# Patient Record
Sex: Female | Born: 1950
Health system: Southern US, Community
[De-identification: ages and names within clinical notes are randomized; demographics above are authoritative.]

## PROBLEM LIST (undated history)

## (undated) DIAGNOSIS — E785 Hyperlipidemia, unspecified: Secondary | ICD-10-CM

## (undated) DIAGNOSIS — Z8719 Personal history of other diseases of the digestive system: Secondary | ICD-10-CM

## (undated) DIAGNOSIS — E66811 Obesity, class 1: Secondary | ICD-10-CM

## (undated) DIAGNOSIS — I10 Essential (primary) hypertension: Secondary | ICD-10-CM

## (undated) DIAGNOSIS — E669 Obesity, unspecified: Secondary | ICD-10-CM

## (undated) DIAGNOSIS — Z8711 Personal history of peptic ulcer disease: Secondary | ICD-10-CM

## (undated) HISTORY — DX: Obesity, unspecified: E66.9

## (undated) HISTORY — DX: Obesity, class 1: E66.811

## (undated) HISTORY — PX: LAPAROSCOPY: SHX197

## (undated) HISTORY — DX: Hyperlipidemia, unspecified: E78.5

---

## 2004-04-09 ENCOUNTER — Emergency Department (HOSPITAL_COMMUNITY): Admission: EM | Admit: 2004-04-09 | Discharge: 2004-04-09 | Payer: Self-pay | Admitting: *Deleted

## 2004-05-15 ENCOUNTER — Emergency Department (HOSPITAL_COMMUNITY): Admission: EM | Admit: 2004-05-15 | Discharge: 2004-05-15 | Payer: Self-pay | Admitting: Emergency Medicine

## 2004-06-01 ENCOUNTER — Ambulatory Visit: Payer: Self-pay | Admitting: Internal Medicine

## 2004-07-07 ENCOUNTER — Ambulatory Visit (HOSPITAL_COMMUNITY): Admission: RE | Admit: 2004-07-07 | Discharge: 2004-07-07 | Payer: Self-pay | Admitting: Internal Medicine

## 2004-08-30 HISTORY — PX: COLONOSCOPY: SHX174

## 2004-12-06 ENCOUNTER — Emergency Department (HOSPITAL_COMMUNITY): Admission: EM | Admit: 2004-12-06 | Discharge: 2004-12-06 | Payer: Self-pay | Admitting: Family Medicine

## 2004-12-10 ENCOUNTER — Other Ambulatory Visit: Admission: RE | Admit: 2004-12-10 | Discharge: 2004-12-10 | Payer: Self-pay | Admitting: Obstetrics and Gynecology

## 2004-12-18 ENCOUNTER — Ambulatory Visit (HOSPITAL_COMMUNITY): Admission: RE | Admit: 2004-12-18 | Discharge: 2004-12-18 | Payer: Self-pay | Admitting: Obstetrics and Gynecology

## 2005-01-01 ENCOUNTER — Ambulatory Visit: Payer: Self-pay | Admitting: Internal Medicine

## 2005-01-08 ENCOUNTER — Ambulatory Visit: Payer: Self-pay | Admitting: Internal Medicine

## 2005-01-28 HISTORY — PX: COMBINED HYSTEROSCOPY DIAGNOSTIC / D&C: SUR297

## 2005-02-05 ENCOUNTER — Ambulatory Visit (HOSPITAL_COMMUNITY): Admission: RE | Admit: 2005-02-05 | Discharge: 2005-02-05 | Payer: Self-pay | Admitting: Obstetrics and Gynecology

## 2005-02-05 ENCOUNTER — Encounter (INDEPENDENT_AMBULATORY_CARE_PROVIDER_SITE_OTHER): Payer: Self-pay | Admitting: Specialist

## 2005-04-19 ENCOUNTER — Encounter (INDEPENDENT_AMBULATORY_CARE_PROVIDER_SITE_OTHER): Payer: Self-pay | Admitting: *Deleted

## 2005-05-15 ENCOUNTER — Emergency Department (HOSPITAL_COMMUNITY): Admission: EM | Admit: 2005-05-15 | Discharge: 2005-05-15 | Payer: Self-pay | Admitting: Family Medicine

## 2005-05-19 ENCOUNTER — Emergency Department (HOSPITAL_COMMUNITY): Admission: EM | Admit: 2005-05-19 | Discharge: 2005-05-19 | Payer: Self-pay | Admitting: Emergency Medicine

## 2005-12-23 ENCOUNTER — Ambulatory Visit: Payer: Self-pay | Admitting: Internal Medicine

## 2006-05-16 ENCOUNTER — Emergency Department (HOSPITAL_COMMUNITY): Admission: EM | Admit: 2006-05-16 | Discharge: 2006-05-16 | Payer: Self-pay | Admitting: Emergency Medicine

## 2006-08-19 DIAGNOSIS — I10 Essential (primary) hypertension: Secondary | ICD-10-CM | POA: Insufficient documentation

## 2007-04-17 ENCOUNTER — Inpatient Hospital Stay (HOSPITAL_COMMUNITY): Admission: AD | Admit: 2007-04-17 | Discharge: 2007-04-17 | Payer: Self-pay | Admitting: Obstetrics and Gynecology

## 2007-04-17 ENCOUNTER — Telehealth: Payer: Self-pay | Admitting: *Deleted

## 2007-05-17 ENCOUNTER — Ambulatory Visit: Payer: Self-pay | Admitting: *Deleted

## 2007-05-17 ENCOUNTER — Encounter: Payer: Self-pay | Admitting: Obstetrics and Gynecology

## 2007-05-18 ENCOUNTER — Emergency Department (HOSPITAL_COMMUNITY): Admission: EM | Admit: 2007-05-18 | Discharge: 2007-05-18 | Payer: Self-pay | Admitting: Emergency Medicine

## 2007-07-19 ENCOUNTER — Emergency Department (HOSPITAL_COMMUNITY): Admission: EM | Admit: 2007-07-19 | Discharge: 2007-07-19 | Payer: Self-pay | Admitting: Family Medicine

## 2007-08-16 ENCOUNTER — Ambulatory Visit: Payer: Self-pay | Admitting: Internal Medicine

## 2007-08-18 ENCOUNTER — Ambulatory Visit: Payer: Self-pay | Admitting: *Deleted

## 2007-09-29 ENCOUNTER — Ambulatory Visit (HOSPITAL_COMMUNITY): Admission: RE | Admit: 2007-09-29 | Discharge: 2007-09-29 | Payer: Self-pay | Admitting: Family Medicine

## 2007-10-13 ENCOUNTER — Ambulatory Visit: Payer: Self-pay | Admitting: Internal Medicine

## 2007-10-13 LAB — CONVERTED CEMR LAB
ALT: 16 units/L (ref 0–35)
AST: 19 units/L (ref 0–37)
Albumin: 4.1 g/dL (ref 3.5–5.2)
Alkaline Phosphatase: 62 units/L (ref 39–117)
BUN: 14 mg/dL (ref 6–23)
Basophils Absolute: 0 10*3/uL (ref 0.0–0.1)
Basophils Relative: 0 % (ref 0–1)
CO2: 27 meq/L (ref 19–32)
Calcium: 9.3 mg/dL (ref 8.4–10.5)
Chloride: 104 meq/L (ref 96–112)
Cholesterol: 210 mg/dL — ABNORMAL HIGH (ref 0–200)
Creatinine, Ser: 0.57 mg/dL (ref 0.40–1.20)
Eosinophils Absolute: 0 10*3/uL (ref 0.0–0.7)
Eosinophils Relative: 0 % (ref 0–5)
Glucose, Bld: 73 mg/dL (ref 70–99)
HCT: 35.3 % — ABNORMAL LOW (ref 36.0–46.0)
HDL: 67 mg/dL (ref >39)
Hemoglobin: 11.2 g/dL — ABNORMAL LOW (ref 12.0–15.0)
LDL Cholesterol: 126 mg/dL — ABNORMAL HIGH (ref 0–99)
Lymphocytes Relative: 44 % (ref 12–46)
Lymphs Abs: 2.1 10*3/uL (ref 0.7–4.0)
MCHC: 31.7 g/dL (ref 30.0–36.0)
MCV: 88.9 fL (ref 78.0–100.0)
Monocytes Absolute: 0.3 10*3/uL (ref 0.1–1.0)
Monocytes Relative: 7 % (ref 3–12)
Neutro Abs: 2.4 10*3/uL (ref 1.7–7.7)
Neutrophils Relative %: 49 % (ref 43–77)
Platelets: 263 10*3/uL (ref 150–400)
Potassium: 4 meq/L (ref 3.5–5.3)
RBC: 3.97 M/uL (ref 3.87–5.11)
RDW: 13.4 % (ref 11.5–15.5)
Sodium: 142 meq/L (ref 135–145)
Total Bilirubin: 0.3 mg/dL (ref 0.3–1.2)
Total CHOL/HDL Ratio: 3.1
Total Protein: 8 g/dL (ref 6.0–8.3)
Triglycerides: 84 mg/dL (ref <150)
VLDL: 17 mg/dL (ref 0–40)
WBC: 4.9 10*3/uL (ref 4.0–10.5)

## 2007-11-21 ENCOUNTER — Ambulatory Visit: Payer: Self-pay | Admitting: Family Medicine

## 2007-11-24 ENCOUNTER — Ambulatory Visit: Payer: Self-pay | Admitting: Internal Medicine

## 2007-11-24 LAB — CONVERTED CEMR LAB
Ferritin: 60 ng/mL (ref 10–291)
Iron: 50 ug/dL (ref 42–145)
Saturation Ratios: 16 % — ABNORMAL LOW (ref 20–55)
TIBC: 306 ug/dL (ref 250–470)
TSH: 1.087 microintl units/mL (ref 0.350–5.50)
UIBC: 256 ug/dL

## 2007-12-18 ENCOUNTER — Ambulatory Visit: Payer: Self-pay | Admitting: Family Medicine

## 2007-12-21 ENCOUNTER — Ambulatory Visit: Payer: Self-pay | Admitting: Obstetrics and Gynecology

## 2008-01-26 ENCOUNTER — Emergency Department (HOSPITAL_COMMUNITY): Admission: EM | Admit: 2008-01-26 | Discharge: 2008-01-26 | Payer: Self-pay | Admitting: Family Medicine

## 2009-01-16 ENCOUNTER — Ambulatory Visit (HOSPITAL_COMMUNITY): Admission: RE | Admit: 2009-01-16 | Discharge: 2009-01-16 | Payer: Self-pay | Admitting: Family Medicine

## 2009-06-04 ENCOUNTER — Emergency Department (HOSPITAL_COMMUNITY): Admission: EM | Admit: 2009-06-04 | Discharge: 2009-06-04 | Payer: Self-pay | Admitting: Emergency Medicine

## 2009-06-11 ENCOUNTER — Emergency Department (HOSPITAL_COMMUNITY): Admission: EM | Admit: 2009-06-11 | Discharge: 2009-06-11 | Payer: Self-pay | Admitting: Emergency Medicine

## 2009-06-16 ENCOUNTER — Emergency Department (HOSPITAL_COMMUNITY): Admission: EM | Admit: 2009-06-16 | Discharge: 2009-06-16 | Payer: Self-pay | Admitting: Emergency Medicine

## 2010-01-28 ENCOUNTER — Emergency Department (HOSPITAL_COMMUNITY): Admission: EM | Admit: 2010-01-28 | Discharge: 2010-01-28 | Payer: Self-pay | Admitting: Family Medicine

## 2010-09-06 ENCOUNTER — Emergency Department (HOSPITAL_COMMUNITY)
Admission: EM | Admit: 2010-09-06 | Discharge: 2010-09-06 | Payer: Self-pay | Source: Home / Self Care | Admitting: Family Medicine

## 2010-09-20 ENCOUNTER — Encounter: Payer: Self-pay | Admitting: Obstetrics and Gynecology

## 2010-09-30 NOTE — Progress Notes (Signed)
  Phone Note Call from Patient   Summary of Call: Pt called and states she has protrusion from her vagina x 2 days.  Hx of same in 2006. denies pain or bleeding.  referred to women's [facility] for treatment, per Dr Meredith Pel.   Pt informed and will go to women's Initial call taken by: Merrie Roof RN,  April 17, 2007 10:23 AM

## 2010-11-11 ENCOUNTER — Inpatient Hospital Stay (INDEPENDENT_AMBULATORY_CARE_PROVIDER_SITE_OTHER)
Admission: RE | Admit: 2010-11-11 | Discharge: 2010-11-11 | Disposition: A | Discharge status: Home / Self Care | Payer: PRIVATE HEALTH INSURANCE | Source: Ambulatory Visit | Attending: Family Medicine | Admitting: Family Medicine

## 2010-11-11 DIAGNOSIS — R05 Cough: Secondary | ICD-10-CM

## 2010-11-11 DIAGNOSIS — R059 Cough, unspecified: Secondary | ICD-10-CM

## 2010-11-11 LAB — POCT URINALYSIS DIPSTICK
Bilirubin Urine: NEGATIVE
Glucose, UA: NEGATIVE mg/dL
Ketones, ur: NEGATIVE mg/dL
Nitrite: NEGATIVE
Protein, ur: NEGATIVE mg/dL
Specific Gravity, Urine: <=1.005 (ref 1.005–1.030)
Urobilinogen, UA: 0.2 mg/dL (ref 0.0–1.0)
pH: 6 (ref 5.0–8.0)

## 2011-01-12 NOTE — Group Therapy Note (Signed)
[name], [name] NO.:  [id number]   MEDICAL RECORD NO.:  [id number]          PATIENT TYPE:  WOC   LOCATION:  [facility]                   FACILITY:  [facility]   PHYSICIAN:  Wilburt Finlay, M.D.     DATE OF BIRTH:  22-Sep-1950   DATE OF SERVICE:  05/17/2007                                  CLINIC NOTE   CHIEF COMPLAINT:  Pressure between legs, question uterus falling out.   HISTORY OF PRESENT ILLNESS:  Patient is a 60 year old female, gravida 1,  para 1, postmenopausal x2 years, who presents with complaints of feeling  a lot of pressure in her legs.  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  Throughout the day, she notes significantly  increasing pressure between the legs.  By the time she gets home, she  feels that she needs to insert a finger into the vagina to push the  uterus back in.  She denies any urinary complaints.  She has been having  vaginal discharge that has not changed.  Complains also of intermittent  vaginal dryness.  Has not been sexually active for at least four years.  Does express desire to increase level of exercise but feels the pressure  between her legs prohibits her from doing this.  Otherwise patient  denies any nausea, vomiting, fever, chills, stool incontinence, stress  urinary incontinence.   PAST MEDICAL HISTORY:  Significant for hypertension and history of  polyps, possibly cervical polyps that were removed several years ago.   PAST OBSTETRICAL HISTORY:  Vaginal delivery 22 years ago.   PAST SURGICAL HISTORY:  Had a D&C greater than 10 years ago.   ALLERGIES:  PENICILLIN.   SOCIAL HISTORY:  She lives by herself, although her daughter with her  new baby is currently residing with her.  Does not smoke.  Denies drug  use or alcohol use.   PHYSICAL EXAMINATION:  She appeared in no apparent distress.  ABDOMEN:  Soft, nontender, nondistended.  No palpable masses noted.  PELVIC:  Normal external genitalia.  Vaginal  mucosa appear normal.  Cervix mildly prolapsed.  Can see about 1 cm inside the introitus.  No  cervical motion tenderness.  No adnexal masses or tenderness.  The  uterus felt small and retroverted.   ASSESSMENT/PLAN:  1. Routine gynecologic visit:  Pap smear done today.  Patient is to      schedule mammogram and have done as soon as she feels out the      necessary paperwork to get it paid on the sliding scale since she      has no insurance.  2. Uterine prolapse:  Discussed options with her, including the      vaginal hysterectomy versus a pessary.  Patient      opted to have a pessary.  A regular pessary, #5, was inserted at      this visit.  Patient showed how to insert and remove pessary.  All      questions were answered.  Follow up as needed.           ______________________________  Leotis Shames  Yetta Barre, M.D.     LJ/MEDQ  D:  05/17/2007  T:  05/18/2007  Job:  [id number]

## 2011-01-12 NOTE — Group Therapy Note (Signed)
[name], [name] NO.:  [id number]   MEDICAL RECORD NO.:  [id number]          PATIENT TYPE:  WOC   LOCATION:  [facility]                   [facility]ACILITY[facility]  [facility]   PHYSICIAN:  Argentina Donovan, MD        DATE OF BIRTH:  [birth year]   DATE OF SERVICE:                                  CLINIC NOTE   CHIEF COMPLAINT:  Her cervix keeps falling out, she keeps pushing it  back in.   PRESENT ILLNESS:  The patient is a 60 year old gravida 1, para 1-0-0-1 3  who is postmenopausal, who gets the terrible pressure between her legs  all the time, and the cervix actually comes out of the vagina.  She can  push it back in.  They tried to use a pessary on her, but the pessary  bothered her so much, she went to [facility] to have it removed.  She is  in for definitive treatment and was told she needs a hysterectomy with  anterior and posterior colporrhaphy repair.  She has not been sexually  active in some time.  She is unable to exercise because of the pressure  when she is on her feet.  Examination of external genitalia is normal  placing a speculum, with a speculum in the vagina.  The cervix is right  at the introitus and hypertrophied.  Vagina is clean with lost  rugations.  The uterus is normal size, shape and consistency on  bimanual.  Adnexa could not be palpated.  The patient, in my estimation,  needs a total vaginal hysterectomy and repair. Her insurance so runs out  today.  She is going to re-establish it tomorrow.  I told her we had  checked with the financial people, and as soon as she could be back, we  can schedule her for definitive treatment with this problem.   DIAGNOSIS:  Third-degree uterine prolapse with cystorectocele.           ______________________________  Argentina Donovan, MD     PR/MEDQ  D:  12/21/2007  T:  12/21/2007  Job:  [id number]

## 2011-01-15 NOTE — H&P (Signed)
[name], [name]             ACCOUNT NO.:  [id number]   MEDICAL RECORD NO.:  [id number]          PATIENT TYPE:  AMB   LOCATION:  [facility]                           [facility]   PHYSICIAN:  [name], M.D.DATE OF BIRTH:  Oct 27, 1950   DATE OF ADMISSION:  DATE OF DISCHARGE:                                HISTORY & PHYSICAL   This is a 60 year old gravida 1, para 1.   CHIEF COMPLAINT:  Post menopausal bleeding.  The patient is some two years  postmenopausal and noticed an episode of vaginal bleeding for three days in  early April 2006.  Examination revealed a cervical polyp.  The patient is  scheduled for diagnostic/operative hysteroscopy.   MEDICATIONS:  Triamterene/hydrochlorothiazide 37.5/25.   PAST MEDICAL HISTORY:  Hypertension.   PAST SURGICAL HISTORY:  Laparoscopy, 1970s.   ALLERGIES:  None.   FAMILY HISTORY:  Noncontributory.   SOCIAL HISTORY:  The patient denies use of tobacco or significant alcohol.   REVIEW OF SYSTEMS:  Noncontributory.   PHYSICAL EXAMINATION:  GENERAL:  Well-developed, well-nourished, pleasant,  black female in no acute distress.  VITAL SIGNS:  Afebrile.  Vital signs stable.  SKIN:  Warm and dry without lesions.  LYMPH:  There is no supraclavicular, cervical, or inguinal adenopathy.  HEENT:  Normocephalic.  NECK:  Supple with thyromegaly.  CHEST:  Lungs are clear.  CARDIAC:  Regular rate and rhythm without murmurs, gallops, or rubs.  BREASTS:  Exam is deferred.  ABDOMEN:  Soft and nontender without masses or organomegaly.  Bowel sounds  are active.  MUSCULOSKELETAL:  No CVA tenderness.  PELVIC:  External genitalia within normal limits.  Vagina is without gross  lesions.  Cervical examination revealed a polyp.  Bimanual examination  revealed the uterus to be normal size, shape, and contour without adnexal  masses.   IMPRESSION:  1.  Cervical polyp.  2.  Postmenopausal bleeding.  3.  Hypertension.   PLAN:  1.   Diagnostic/operative hysteroscopy.  2.  Dilatation and curettage.  Risks, benefits, complications, and      alternatives were fully discussed with the patient.  She states she      understands and accepts.  Questions were invited and answered.       JAM/MEDQ  D:  02/04/2005  T:  02/04/2005  Job:  [id number]

## 2011-01-15 NOTE — Op Note (Signed)
NAMEMARYFER, [name]             ACCOUNT NO.:  [id number]   MEDICAL RECORD NO.:  [id number]          PATIENT TYPE:  AMB   LOCATION:  [facility]                           [facility]   PHYSICIAN:  James A. Ashley Royalty, M.D.DATE OF BIRTH:  11-18-1950   DATE OF PROCEDURE:  02/05/2005  DATE OF DISCHARGE:                                 OPERATIVE REPORT   PREOPERATIVE DIAGNOSIS:  1.  Cervical versus uterine polyp.  2.  Postmenopausal bleeding, rule out neoplasia.   POSTOPERATIVE DIAGNOSIS:  1.  Cervical versus uterine polyp.  2.  Postmenopausal bleeding, rule out neoplasia.  3.  Apparent endometrial polyp.  4.  Pathology pending.   PROCEDURE:  1.  Diagnostic/operative hysteroscopy.  2.  Dilatation and curettage.   SURGEON:  Rudy Jew. Ashley Royalty, M.D.   ANESTHESIA:  Monitored anesthesia care with 1% Xylocaine paracervical block  (20 mL).   FINDINGS:  Uterus sounded to approximately 7 cm.   ESTIMATED BLOOD LOSS:  50 mL.   COMPLICATIONS:  None.   DRAINS:  None.   DESCRIPTION OF PROCEDURE:  The patient was taken to the operating room and  placed in the dorsal supine position.  After monitored anesthesia care was  administered, she was placed in the lithotomy position and prepped and  draped in the usual fashion for vaginal surgery.  Posterior weighted  retractor was placed per vagina.  The anterior lip of the cervix was grasped  with a single tooth tenaculum.  Approximately 20 mL of 1% Xylocaine were  instilled circumferentially around the cervix to create a paracervical  block.  The uterus was then sounded to 7 cm.  It was noted to be  retroverted.  The cervix was then dilated to a size 29 [place] using [employer]  dilators.  The resectoscope was then placed using Sorbitol as the distention  medium.  The uterine cavity was inspected.  The right tube ostium was  identified.  The left tube ostium was less apparent, but its location  inferred by the contour of the cavity, seen to be located  slightly behind an  undulation in the cavity that did not appear to be abnormal.  Appropriate  photos were obtained.  There was a small apparent polyp in the endometrial  cavity which was removed with the resectoscope using the cutting waveform.  It was submitted to pathology separately for histologic studies.  Coagulation waveform was then used to help assure hemostasis.  Remainder of  the cavity and endocervix was inspected.  No separate polyp was identified.  Appropriate photos were obtained.   Attention was then turned to the curettage.  A medium size curet was  introduced into the uterine cavity and curettage performed.  First a four  quadrant technique was used.  Then a therapeutic technique was used.  Curettings were submitted separately to pathology for histologic studies.   At this point, the instruments were removed from the cervix.  A small  laceration was noted on the tenaculum site.  This was easily closed with 2-0  chromic catgut in a figure-of-eight fashion.  Hemostasis was noted and the  procedure  terminated.   The patient tolerated the procedure extremely well and was returned to the  recovery room in good condition.       JAM/MEDQ  D:  02/05/2005  T:  02/05/2005  Job:  [id number]

## 2011-06-11 LAB — URINALYSIS, ROUTINE W REFLEX MICROSCOPIC
Bilirubin Urine: NEGATIVE
Glucose, UA: NEGATIVE
Hgb urine dipstick: NEGATIVE
Ketones, ur: NEGATIVE
Nitrite: NEGATIVE
Protein, ur: NEGATIVE
Specific Gravity, Urine: >1.03 — ABNORMAL HIGH
Urobilinogen, UA: 0.2
pH: 6

## 2011-06-11 LAB — WET PREP, GENITAL
Clue Cells Wet Prep HPF POC: NONE SEEN
Trich, Wet Prep: NONE SEEN
Yeast Wet Prep HPF POC: NONE SEEN

## 2011-06-11 LAB — URINE MICROSCOPIC-ADD ON

## 2011-06-12 ENCOUNTER — Emergency Department (HOSPITAL_COMMUNITY)
Admission: EM | Admit: 2011-06-12 | Discharge: 2011-06-12 | Disposition: A | Discharge status: Home / Self Care | Payer: PRIVATE HEALTH INSURANCE | Attending: Emergency Medicine | Admitting: Emergency Medicine

## 2011-06-12 DIAGNOSIS — H5789 Other specified disorders of eye and adnexa: Secondary | ICD-10-CM | POA: Insufficient documentation

## 2011-06-12 DIAGNOSIS — Z79899 Other long term (current) drug therapy: Secondary | ICD-10-CM | POA: Insufficient documentation

## 2011-06-12 DIAGNOSIS — H11419 Vascular abnormalities of conjunctiva, unspecified eye: Secondary | ICD-10-CM | POA: Insufficient documentation

## 2011-06-12 DIAGNOSIS — I10 Essential (primary) hypertension: Secondary | ICD-10-CM | POA: Insufficient documentation

## 2011-06-12 DIAGNOSIS — Z7982 Long term (current) use of aspirin: Secondary | ICD-10-CM | POA: Insufficient documentation

## 2011-06-12 DIAGNOSIS — H109 Unspecified conjunctivitis: Secondary | ICD-10-CM | POA: Insufficient documentation

## 2011-06-19 ENCOUNTER — Emergency Department (HOSPITAL_COMMUNITY)
Admission: EM | Admit: 2011-06-19 | Discharge: 2011-06-19 | Disposition: A | Discharge status: Home / Self Care | Payer: Worker's Compensation | Attending: Emergency Medicine | Admitting: Emergency Medicine

## 2011-06-19 ENCOUNTER — Inpatient Hospital Stay (INDEPENDENT_AMBULATORY_CARE_PROVIDER_SITE_OTHER)
Admission: RE | Admit: 2011-06-19 | Discharge: 2011-06-19 | Disposition: A | Discharge status: Home / Self Care | Payer: PRIVATE HEALTH INSURANCE | Source: Ambulatory Visit | Attending: Emergency Medicine | Admitting: Emergency Medicine

## 2011-06-19 ENCOUNTER — Emergency Department (HOSPITAL_COMMUNITY): Payer: Worker's Compensation

## 2011-06-19 DIAGNOSIS — R51 Headache: Secondary | ICD-10-CM

## 2011-06-19 DIAGNOSIS — Y990 Civilian activity done for income or pay: Secondary | ICD-10-CM | POA: Insufficient documentation

## 2011-06-19 DIAGNOSIS — S1093XA Contusion of unspecified part of neck, initial encounter: Secondary | ICD-10-CM | POA: Insufficient documentation

## 2011-06-19 DIAGNOSIS — I10 Essential (primary) hypertension: Secondary | ICD-10-CM | POA: Insufficient documentation

## 2011-06-19 DIAGNOSIS — R42 Dizziness and giddiness: Secondary | ICD-10-CM | POA: Insufficient documentation

## 2011-06-19 DIAGNOSIS — S0990XA Unspecified injury of head, initial encounter: Secondary | ICD-10-CM

## 2011-06-19 DIAGNOSIS — IMO0002 Reserved for concepts with insufficient information to code with codable children: Secondary | ICD-10-CM | POA: Insufficient documentation

## 2011-06-19 DIAGNOSIS — H9209 Otalgia, unspecified ear: Secondary | ICD-10-CM | POA: Insufficient documentation

## 2011-06-19 DIAGNOSIS — S0003XA Contusion of scalp, initial encounter: Secondary | ICD-10-CM | POA: Insufficient documentation

## 2011-12-02 ENCOUNTER — Other Ambulatory Visit: Payer: Self-pay | Admitting: Obstetrics and Gynecology

## 2011-12-02 DIAGNOSIS — Z1231 Encounter for screening mammogram for malignant neoplasm of breast: Secondary | ICD-10-CM

## 2011-12-05 ENCOUNTER — Encounter (HOSPITAL_COMMUNITY): Payer: Self-pay | Admitting: *Deleted

## 2011-12-05 ENCOUNTER — Emergency Department (INDEPENDENT_AMBULATORY_CARE_PROVIDER_SITE_OTHER)
Admission: EM | Admit: 2011-12-05 | Discharge: 2011-12-05 | Disposition: A | Discharge status: Home / Self Care | Payer: Self-pay | Source: Home / Self Care | Attending: Family Medicine | Admitting: Family Medicine

## 2011-12-05 DIAGNOSIS — J0100 Acute maxillary sinusitis, unspecified: Secondary | ICD-10-CM

## 2011-12-05 DIAGNOSIS — J40 Bronchitis, not specified as acute or chronic: Secondary | ICD-10-CM

## 2011-12-05 HISTORY — DX: Essential (primary) hypertension: I10

## 2011-12-05 MED ORDER — AZITHROMYCIN 250 MG PO TABS: ORAL_TABLET | ORAL | Status: AC

## 2011-12-05 MED ORDER — CETIRIZINE HCL 10 MG PO CHEW: 10.0000 mg | CHEWABLE_TABLET | Freq: Every day | ORAL | Status: DC

## 2011-12-05 NOTE — ED Notes (Addendum)
Pt with onset of cough and congestion x 2 weeks taking mucinex DM and coricidin without relief - pt bp elevated

## 2011-12-05 NOTE — Discharge Instructions (Signed)
Bronchitis  Bronchitis is the body's way of reacting to injury and/or infection (inflammation) of the bronchi. Bronchi are the air tubes that extend from the windpipe into the lungs. If the inflammation becomes severe, it may cause shortness of breath.  CAUSES   Inflammation may be caused by:   A virus.   Germs (bacteria).   Dust.   Allergens.   Pollutants and many other irritants.  The cells lining the bronchial tree are covered with tiny hairs (cilia). These constantly beat upward, away from the lungs, toward the mouth. This keeps the lungs free of pollutants. When these cells become too irritated and are unable to do their job, mucus begins to develop. This causes the characteristic cough of bronchitis. The cough clears the lungs when the cilia are unable to do their job. Without either of these protective mechanisms, the mucus would settle in the lungs. Then you would develop pneumonia.  Smoking is a common cause of bronchitis and can contribute to pneumonia. Stopping this habit is the single most important thing you can do to help yourself.  TREATMENT    Your caregiver may prescribe an antibiotic if the cough is caused by bacteria. Also, medicines that open up your airways make it easier to breathe. Your caregiver may also recommend or prescribe an expectorant. It will loosen the mucus to be coughed up. Only take over-the-counter or prescription medicines for pain, discomfort, or fever as directed by your caregiver.   Removing whatever causes the problem (smoking, for example) is critical to preventing the problem from getting worse.   Cough suppressants may be prescribed for relief of cough symptoms.   Inhaled medicines may be prescribed to help with symptoms now and to help prevent problems from returning.   For those with recurrent (chronic) bronchitis, there may be a need for steroid medicines.  SEEK IMMEDIATE MEDICAL CARE IF:    During treatment, you develop more pus-like mucus (purulent  sputum).   You have a fever.   Your baby is older than 3 months with a rectal temperature of 102 F (38.9 C) or higher.   Your baby is 3 months old or younger with a rectal temperature of 100.4 F (38 C) or higher.   You become progressively more ill.   You have increased difficulty breathing, wheezing, or shortness of breath.  It is necessary to seek immediate medical care if you are elderly or sick from any other disease.  MAKE SURE YOU:    Understand these instructions.   Will watch your condition.   Will get help right away if you are not doing well or get worse.  Document Released: 08/16/2005 Document Revised: 08/05/2011 Document Reviewed: 06/25/2008  ExitCare Patient Information 2012 [employer]  Sinusitis  Sinuses are air pockets within the bones of your face. The growth of bacteria within a sinus leads to infection. The infection prevents the sinuses from draining. This infection is called sinusitis.  SYMPTOMS   There will be different areas of pain depending on which sinuses have become infected.   The maxillary sinuses often produce pain beneath the eyes.   Frontal sinusitis may cause pain in the middle of the forehead and above the eyes.  Other problems (symptoms) include:   Toothaches.   Colored, pus-like (purulent) drainage from the nose.   Swelling, warmth, and tenderness over the sinus areas may be signs of infection.  TREATMENT   Sinusitis is most often determined by an exam.X-rays may be taken. If x-rays have   been taken, make sure you obtain your results or find out how you are to obtain them. Your caregiver may give you medications (antibiotics). These are medications that will help kill the bacteria causing the infection. You may also be given a medication (decongestant) that helps to reduce sinus swelling.   HOME CARE INSTRUCTIONS    Only take over-the-counter or prescription medicines for pain, discomfort, or fever as directed by your caregiver.   Drink extra fluids. Fluids  help thin the mucus so your sinuses can drain more easily.   Applying either moist heat or ice packs to the sinus areas may help relieve discomfort.   Use saline nasal sprays to help moisten your sinuses. The sprays can be found at your local drugstore.  SEEK IMMEDIATE MEDICAL CARE IF:   You have a fever.   You have increasing pain, severe headaches, or toothache.   You have nausea, vomiting, or drowsiness.   You develop unusual swelling around the face or trouble seeing.  MAKE SURE YOU:    Understand these instructions.   Will watch your condition.   Will get help right away if you are not doing well or get worse.  Document Released: 08/16/2005 Document Revised: 08/05/2011 Document Reviewed: 03/15/2007  Exit[employer] Patient Information 2012 [employer].

## 2011-12-05 NOTE — ED Provider Notes (Signed)
History     CSN: [id number]  Arrival date & time 12/05/11  1519   First MD Initiated Contact with Patient 12/05/11 1528      Chief Complaint  Patient presents with  . Cough  . Nasal Congestion    (Consider location/radiation/quality/duration/timing/severity/associated sxs/prior treatment) Patient is a 61 y.o. female presenting with cough. The history is provided by the patient.  Cough This is a new problem. The current episode started more than 1 week ago (on going before Easter). The problem occurs constantly. The problem has been gradually worsening. The cough is productive of sputum. There has been no fever. Associated symptoms include headaches and rhinorrhea. Pertinent negatives include no chest pain, no chills, no sweats, no weight loss, no ear congestion, no ear pain, no sore throat, no myalgias, no shortness of breath, no wheezing and no eye redness. She has tried cough syrup for the symptoms. The treatment provided no relief. She is not a smoker. Her past medical history is significant for emphysema. Her past medical history does not include bronchitis, pneumonia, bronchiectasis, COPD or asthma.    Past Medical History  Diagnosis Date  . Hypertension     History reviewed. No pertinent past surgical history.  History reviewed. No pertinent family history.  History  Substance Use Topics  . Smoking status: Never Smoker   . Smokeless tobacco: Not on file  . Alcohol Use: No    OB History    Grav Para Term Preterm Abortions TAB SAB Ect Mult Living                  Review of Systems  Constitutional: Negative for chills and weight loss.  HENT: Positive for rhinorrhea. Negative for ear pain and sore throat.   Eyes: Negative for redness.  Respiratory: Positive for cough. Negative for shortness of breath and wheezing.   Cardiovascular: Negative for chest pain.  Musculoskeletal: Negative for myalgias.  Neurological: Positive for headaches.    Allergies   Penicillins  Home Medications   Current Outpatient Rx  Name Route Sig Dispense Refill  . ASPIRIN 81 MG PO TABS Oral Take 81 mg by mouth daily.    . CORICIDIN HBP COLD/FLU PO Oral Take by mouth.    Marland Kitchen VITAMIN D 1000 UNITS PO TABS Oral Take 1,000 Units by mouth daily.    Marland Kitchen DM-GUAIFENESIN ER 30-600 MG PO TB12 Oral Take 1 tablet by mouth every 12 (twelve) hours.    . OMEGA-3 FATTY ACIDS 1000 MG PO CAPS Oral Take 2 g by mouth daily.    . MULTIVITAMINS PO CAPS Oral Take 1 capsule by mouth daily.    Marland Kitchen PRESCRIPTION MEDICATION  Pt takes bp med and fluid pill unknown name      BP 147/101  Pulse 89  Temp(Src) 98.2 F (36.8 C) (Oral)  Resp 20  SpO2 95%  Physical Exam  Nursing note and vitals reviewed. Constitutional: She is oriented to person, place, and time. She appears well-developed and well-nourished. No distress.  HENT:  Head: Normocephalic and atraumatic.  Right Ear: Tympanic membrane normal.  Left Ear: Tympanic membrane normal.  Nose: Nose normal.  Mouth/Throat: Oropharynx is clear and moist. No oropharyngeal exudate.  Eyes: Right eye exhibits no discharge. Left eye exhibits no discharge. No scleral icterus.  Neck: Neck supple.  Cardiovascular: Normal rate, regular rhythm and normal heart sounds.   Pulmonary/Chest: Effort normal. No respiratory distress. She has no wheezes. She has rhonchi. She has no rales.  Lymphadenopathy:  She has cervical adenopathy.  Neurological: She is alert and oriented to person, place, and time.  Skin: Skin is warm and dry. There is erythema.    ED Course  Procedures (including critical care time)   Sinusitis/bronchitis z pack  Zyrtec 10mg  po q day   MDM          Hassan Rowan, MD 12/05/11 2123

## 2012-01-13 ENCOUNTER — Ambulatory Visit (INDEPENDENT_AMBULATORY_CARE_PROVIDER_SITE_OTHER): Payer: Self-pay | Admitting: Obstetrics & Gynecology

## 2012-01-13 ENCOUNTER — Encounter: Payer: Self-pay | Admitting: Obstetrics & Gynecology

## 2012-01-13 ENCOUNTER — Ambulatory Visit (HOSPITAL_COMMUNITY)
Admission: RE | Admit: 2012-01-13 | Discharge: 2012-01-13 | Disposition: A | Discharge status: Home / Self Care | Payer: Self-pay | Source: Ambulatory Visit | Attending: Obstetrics and Gynecology | Admitting: Obstetrics and Gynecology

## 2012-01-13 VITALS — BP 139/95 | HR 73 | Temp 97.6°F | Ht 63.0 in | Wt 169.7 lb

## 2012-01-13 DIAGNOSIS — Z1231 Encounter for screening mammogram for malignant neoplasm of breast: Secondary | ICD-10-CM

## 2012-01-13 DIAGNOSIS — N814 Uterovaginal prolapse, unspecified: Secondary | ICD-10-CM

## 2012-01-13 DIAGNOSIS — Z1239 Encounter for other screening for malignant neoplasm of breast: Secondary | ICD-10-CM

## 2012-01-13 MED ORDER — LISINOPRIL 10 MG PO TABS: 10.0000 mg | ORAL_TABLET | Freq: Every day | ORAL | Status: DC

## 2012-01-13 MED ORDER — ESTROGENS, CONJUGATED 0.625 MG/GM VA CREA: TOPICAL_CREAM | VAGINAL | Status: DC

## 2012-01-13 MED ORDER — HYDROCHLOROTHIAZIDE 12.5 MG PO TABS: 12.5000 mg | ORAL_TABLET | Freq: Every day | ORAL | Status: DC

## 2012-01-13 NOTE — Progress Notes (Signed)
No complaints today.  Pap Smear:    Pap smear not performed today. Patients last Pap smear was 10/25/11 at the free cervical cancer screening at the [facility] and normal. Per patient she has no history of abnormal Pap smears. Pap smear result above is in EPIC under media.  Physical exam: Breasts Breasts symmetrical. No skin abnormalities bilateral breasts. No nipple retraction bilateral breasts. No nipple discharge bilateral breasts. No lymphadenopathy. No lumps palpated bilateral breasts. No complaints of pain or tenderness on exam         Pelvic/Bimanual No Pap smear completed today since last Pap smear was 10/25/11 and normal. Pap smear not indicated per BCCCP guidelines.   Taught patient how to perform BSE. Patient did not need a Pap smear today due to last Pap smear was 10/25/11. Let her know BCCCP will cover Pap smears every 3 years unless has a history of abnormal Pap smears. Patient is escorted to mammography for a screening mammogram. Let patient know will follow up with her within the next couple weeks with results. Patient verbalized understanding.

## 2012-01-13 NOTE — Progress Notes (Signed)
  Subjective:    Patient ID: [name], female    DOB: [birth year], 61 y.o.   MRN: [id number]  HPI  Ms. Gingerich has a 2 year history of cystocele and uterine prolapse. She tried a pessary in the past but doesn't think that it fit right. She wants it "tucked" but does not want a hysterectomy. She denies current urinary incontinence but gives a distant history of some GSUI.  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 I have counseled her about surgery and pessary. I have told her that if she wants a surgical option, that a hysterectomy would need to be done. I also told her about the failure rate of TVH/ant repair with regard to recurrent prolapse. She wants to try a different pessary. She will use vaginal estrogen for 1 month prior to pessary placement. I have agreed to refill her BP meds for 1 month while she finds a family practice doctor to manage her HTN.

## 2012-01-13 NOTE — Progress Notes (Signed)
Patient states she is not taking hctz or lisinopril because her Rx ran out

## 2012-01-26 ENCOUNTER — Telehealth: Payer: Self-pay | Admitting: *Deleted

## 2012-01-26 NOTE — Telephone Encounter (Signed)
Patient called stating was seen in office on 5/16 and was given a cream that cost $200 can not afford that. Would like call back

## 2012-01-26 NOTE — Telephone Encounter (Signed)
Patient was prescribed Premarin, it is too expensive for her. Will route to Dr. Marice Potter for alternate medicine.

## 2012-02-21 ENCOUNTER — Telehealth: Payer: Self-pay | Admitting: *Deleted

## 2012-02-21 ENCOUNTER — Encounter: Payer: Self-pay | Admitting: *Deleted

## 2012-02-21 NOTE — Telephone Encounter (Signed)
Telephoned patient at home # and left message to return call to [phone]. 

## 2012-02-21 NOTE — Telephone Encounter (Signed)
Pt returned call and advised pap smear was normal.

## 2012-02-23 ENCOUNTER — Ambulatory Visit: Payer: Self-pay | Admitting: Obstetrics & Gynecology

## 2012-03-13 ENCOUNTER — Telehealth: Payer: Self-pay

## 2012-03-13 NOTE — Telephone Encounter (Signed)
Pt called and stated that she had a concern about her pessary insertion could someone please give [place] a call.

## 2012-03-15 ENCOUNTER — Other Ambulatory Visit: Payer: Self-pay

## 2012-03-15 MED ORDER — ESTROGENS, CONJUGATED 0.625 MG/GM VA CREA: TOPICAL_CREAM | VAGINAL | Status: DC

## 2012-03-15 NOTE — Telephone Encounter (Signed)
Called pt and pt informed me that the premarin cream that she was prescribed is to expensive could she have something else.  I advised pt that she could go to [facility] and they have the generic Rx for $4.00.  Pt gave me [facility] on [address] to e-prescribe medication and stated that she would go pick up the Rx first then call to schedule an appt so that she will be taking the cream for 30 days. Pt had no further questions.

## 2012-03-15 NOTE — Telephone Encounter (Signed)
Called pt and informed pt that [facility] does not have a generic for premarin.  Per Dr. Marice Potter pt can have estrogen cream compounded.  Called [facility] @ [phone] and prescribed estrogen cream for the pt for $38.00 per month.  Gave pt info to [facility] and the # and informed her to wait at least 4 days for the cream to be compounded. Pt stated understanding and had no further questions.

## 2012-04-24 ENCOUNTER — Telehealth: Payer: Self-pay | Admitting: Medical

## 2012-04-24 NOTE — Telephone Encounter (Signed)
Patient presents to the window with questions about the cream that was called in. Dr. Marice Potter had prescribed premarin and this was too expensive. The patient was then instructed to go to [facility] and get the generic for premarin. The patient was not aware that this was the exact same medication and was unsure or how to take it and how long to take it. I have explained why it was prescribed and told her to start using it as soon as possible and up until her next appointment for a pessary fitting as was originally planned by Dr. Marice Potter. The patient will call next week to get an appointment for early October for her pessary fitting. The patient voices no other questions or concerns at this time.

## 2012-05-28 ENCOUNTER — Encounter (HOSPITAL_COMMUNITY): Payer: Self-pay | Admitting: *Deleted

## 2012-05-28 ENCOUNTER — Emergency Department (INDEPENDENT_AMBULATORY_CARE_PROVIDER_SITE_OTHER)
Admission: EM | Admit: 2012-05-28 | Discharge: 2012-05-28 | Disposition: A | Discharge status: Home / Self Care | Payer: Self-pay | Source: Home / Self Care | Attending: Family Medicine | Admitting: Family Medicine

## 2012-05-28 DIAGNOSIS — S39012A Strain of muscle, fascia and tendon of lower back, initial encounter: Secondary | ICD-10-CM

## 2012-05-28 DIAGNOSIS — S335XXA Sprain of ligaments of lumbar spine, initial encounter: Secondary | ICD-10-CM

## 2012-05-28 MED ORDER — CYCLOBENZAPRINE HCL 5 MG PO TABS: 5.0000 mg | ORAL_TABLET | Freq: Three times a day (TID) | ORAL | Status: DC | PRN

## 2012-05-28 MED ORDER — DICLOFENAC POTASSIUM 50 MG PO TABS: 50.0000 mg | ORAL_TABLET | Freq: Three times a day (TID) | ORAL | Status: DC

## 2012-05-28 NOTE — ED Notes (Signed)
Pt reports lower back pain for the past week with no relief from otc rubs and back support.

## 2012-05-28 NOTE — ED Provider Notes (Signed)
History     CSN: [id number]  Arrival date & time 05/28/12  0907   First MD Initiated Contact with Patient 05/28/12 [phone]      Chief Complaint  Patient presents with  . Back Pain    (Consider location/radiation/quality/duration/timing/severity/associated sxs/prior treatment) Patient is a 61 y.o. female presenting with back pain. The history is provided by the patient.  Back Pain  This is a new problem. The current episode started more than 2 days ago. The problem has been gradually worsening. The pain is associated with lifting heavy objects (moving furniture in house  after daughter moved out ). The pain is present in the lumbar spine. Worse during: worse with sitting and getting out of bed. Pertinent negatives include no numbness, no abdominal pain, no bowel incontinence, no perianal numbness, no bladder incontinence, no pelvic pain, no leg pain, no paresthesias and no weakness.    Past Medical History  Diagnosis Date  . Hypertension   . Hyperlipidemia   . Obesity (BMI 30.0-34.9)     Past Surgical History  Procedure Date  . Colonoscopy 2006    Family History  Problem Relation Age of Onset  . Hypertension Mother   . Hypertension Sister   . Hypertension Maternal Aunt   . Heart disease Maternal Aunt   . Hypertension Maternal Uncle     History  Substance Use Topics  . Smoking status: Never Smoker   . Smokeless tobacco: Not on file  . Alcohol Use: No    OB History    Grav Para Term Preterm Abortions TAB SAB Ect Mult Living                  Review of Systems  Constitutional: Negative.   Cardiovascular: Negative.   Gastrointestinal: Negative.  Negative for abdominal pain and bowel incontinence.  Genitourinary: Negative.  Negative for bladder incontinence and pelvic pain.  Musculoskeletal: Positive for back pain.  Neurological: Negative for weakness, numbness and paresthesias.    Allergies  Penicillins  Home Medications   Current Outpatient Rx  Name Route  Sig Dispense Refill  . ASPIRIN 81 MG PO TABS Oral Take 81 mg by mouth daily.    Marland Kitchen CETIRIZINE HCL 10 MG PO CHEW Oral Chew 1 tablet (10 mg total) by mouth daily. 30 tablet 0  . CORICIDIN HBP COLD/FLU PO Oral Take by mouth.    Marland Kitchen VITAMIN D 1000 UNITS PO TABS Oral Take 1,000 Units by mouth daily.    Marland Kitchen ESTROGENS, CONJUGATED 0.625 MG/GM VA CREA  Place 1 gram of cream per vagina every other night Pt will request generic for Rx. 42.5 g 12  . CYCLOBENZAPRINE HCL 5 MG PO TABS Oral Take 1 tablet (5 mg total) by mouth 3 (three) times daily as needed for muscle spasms. 30 tablet 0  . DM-GUAIFENESIN ER 30-600 MG PO TB12 Oral Take 1 tablet by mouth every 12 (twelve) hours.    Marland Kitchen DICLOFENAC POTASSIUM 50 MG PO TABS Oral Take 1 tablet (50 mg total) by mouth 3 (three) times daily. For back pain 30 tablet 0  . OMEGA-3 FATTY ACIDS 1000 MG PO CAPS Oral Take 2 g by mouth daily.    Marland Kitchen HYDROCHLOROTHIAZIDE 12.5 MG PO TABS Oral Take 1 tablet (12.5 mg total) by mouth daily. 31 tablet 0  . LISINOPRIL 10 MG PO TABS Oral Take 1 tablet (10 mg total) by mouth daily. 31 tablet 0  . MULTIVITAMINS PO CAPS Oral Take 1 capsule by mouth daily.    Marland Kitchen  PRESCRIPTION MEDICATION  Pt takes bp med and fluid pill unknown name      BP 161/89  Pulse 85  Temp 97.7 F (36.5 C) (Oral)  Resp 18  SpO2 100%  Physical Exam  Nursing note and vitals reviewed. Constitutional: She appears well-developed and well-nourished.  Abdominal: Soft. Bowel sounds are normal.  Musculoskeletal: She exhibits tenderness.       Lumbar back: She exhibits decreased range of motion, tenderness and spasm. She exhibits normal pulse.  Neurological: She is alert.  Skin: Skin is warm and dry.    ED Course  Procedures (including critical care time)  Labs Reviewed - No data to display No results found.   1. Strain of lumbar paraspinous muscle       MDM         Linna Hoff, MD 05/28/12 [phone]

## 2012-05-28 NOTE — ED Notes (Signed)
Call from [facility], asking to change medication dose to 75 mg (from 50 mg) and from TID to QD

## 2012-06-21 ENCOUNTER — Emergency Department (INDEPENDENT_AMBULATORY_CARE_PROVIDER_SITE_OTHER): Payer: Self-pay

## 2012-06-21 ENCOUNTER — Emergency Department (INDEPENDENT_AMBULATORY_CARE_PROVIDER_SITE_OTHER)
Admission: EM | Admit: 2012-06-21 | Discharge: 2012-06-21 | Disposition: A | Discharge status: Home / Self Care | Payer: Self-pay | Source: Home / Self Care | Attending: Emergency Medicine | Admitting: Emergency Medicine

## 2012-06-21 DIAGNOSIS — S335XXA Sprain of ligaments of lumbar spine, initial encounter: Secondary | ICD-10-CM

## 2012-06-21 NOTE — ED Notes (Signed)
Reports pulled muscle in back and groin area on Saturday.   Patient states she picked up potatoes and the bag was heavy.  Patient reports this is the second time for this incident.  Reports she fell outside trying to come in for appointment.  Reports she fell on hands and is okay.

## 2012-06-26 ENCOUNTER — Emergency Department (HOSPITAL_COMMUNITY)
Admission: EM | Admit: 2012-06-26 | Discharge: 2012-06-26 | Disposition: A | Discharge status: Home / Self Care | Payer: Self-pay | Source: Home / Self Care | Attending: Emergency Medicine | Admitting: Emergency Medicine

## 2012-06-26 ENCOUNTER — Encounter (HOSPITAL_COMMUNITY): Payer: Self-pay | Admitting: *Deleted

## 2012-06-26 DIAGNOSIS — N760 Acute vaginitis: Secondary | ICD-10-CM

## 2012-06-26 DIAGNOSIS — IMO0002 Reserved for concepts with insufficient information to code with codable children: Secondary | ICD-10-CM

## 2012-06-26 DIAGNOSIS — A499 Bacterial infection, unspecified: Secondary | ICD-10-CM

## 2012-06-26 DIAGNOSIS — B9689 Other specified bacterial agents as the cause of diseases classified elsewhere: Secondary | ICD-10-CM

## 2012-06-26 LAB — POCT URINALYSIS DIP (DEVICE)
Bilirubin Urine: NEGATIVE
Glucose, UA: NEGATIVE mg/dL
Hgb urine dipstick: NEGATIVE
Ketones, ur: NEGATIVE mg/dL
Leukocytes, UA: NEGATIVE
Nitrite: NEGATIVE
Protein, ur: NEGATIVE mg/dL
Specific Gravity, Urine: 1.02 (ref 1.005–1.030)
Urobilinogen, UA: 1 mg/dL (ref 0.0–1.0)
pH: 7 (ref 5.0–8.0)

## 2012-06-26 LAB — WET PREP, GENITAL
Trich, Wet Prep: NONE SEEN
WBC, Wet Prep HPF POC: NONE SEEN
Yeast Wet Prep HPF POC: NONE SEEN

## 2012-06-26 MED ORDER — METRONIDAZOLE 500 MG PO TABS: 500.0000 mg | ORAL_TABLET | Freq: Two times a day (BID) | ORAL | Status: DC

## 2012-06-26 NOTE — ED Provider Notes (Signed)
Chief Complaint  Patient presents with  . Leg Pain    History of Present Illness:   The patient is a 61 year old female who presents today with multiple issues. She's been here twice before for lower back problems. She strained her lower back about 2 weeks ago and then injured again this past Saturday. She was seen here at the onset of the pain and was given muscle relaxers and pain pills. These did help a little bit. She was seen again about 5 days ago an x-ray was done at that time which showed degenerative disease of the lumbar spine. She was referred to go for orthopedics but has not yet made an appointment. She still bothered by the lower back pain which is worse when she gets up from a sitting or lying position to a stand and is better with walking. She notes aching in her legs and hips left more so than right and is sometimes goes all way down to her feet. She denies any numbness or tingling. There is no muscle weakness. She denies any abdominal pain, incontinence of urine or stool or saddle anesthesia. She's had no fever, chills, or weight loss. Her second problem has been that of what she thinks is a urinary odor. She denies any dysuria, frequency, urgency, or blood in the urine. She has a history of uterine prolapse and is seeing a gynecologist for this. The gynecologist as recommended a pessary. She denies any vaginal discharge, itching, irritation, or irregular bleeding.  Review of Systems:  Other than noted above, the patient denies any of the following symptoms: Systemic:  No fever, chills, sweats, fatigue, or weight loss. GI:  No abdominal pain, nausea, anorexia, vomiting, diarrhea, constipation, melena or hematochezia. GU:  No dysuria, frequency, urgency, hematuria, vaginal discharge, itching, or abnormal vaginal bleeding. Skin:  No rash or itching.  PMFSH:  Past medical history, family history, social history, meds, and allergies were reviewed.  Physical Exam:   Vital signs:  BP  149/88  Pulse 80  Temp 98.1 F (36.7 C) (Oral)  Resp 16  SpO2 100% General:  Alert, oriented and in no distress. Lungs:  Breath sounds clear and equal bilaterally.  No wheezes, rales or rhonchi. Heart:  Regular rhythm.  No gallops or murmers. Abdomen:  Soft, flat and non-distended.  No organomegaly or mass.  No tenderness, guarding or rebound.  Bowel sounds normally active. Back: There is no pain to palpation, no CVA tenderness. She has a somewhat limited range of motion with 45 of flexion, 10 of extension, 20 of lateral bending, and 45 of rotation with pain. Straight leg raising is positive on the left with a negative Lasegue's sign and negative popliteal compression. Straight leg raising is negative on the right. Pelvic exam:  Normal external genitalia. She has uterine prolapse. There was no discharge, bleeding, older. Vaginal mucosa was atrophic. Cervix appeared normal. Uterus was midposition and normal in size and shape. There were no adnexal masses or tenderness. Neurological: Normal muscle strength, sensation, and DTRs. Skin:  Clear, warm and dry.  Labs:   Results for orders placed during the hospital encounter of 06/26/12  POCT URINALYSIS DIP (DEVICE)      Component Value Range   Glucose, UA NEGATIVE  NEGATIVE mg/dL   Bilirubin Urine NEGATIVE  NEGATIVE   Ketones, ur NEGATIVE  NEGATIVE mg/dL   Specific Gravity, Urine 1.020  1.005 - 1.030   Hgb urine dipstick NEGATIVE  NEGATIVE   pH 7.0  5.0 - 8.0  Protein, ur NEGATIVE  NEGATIVE mg/dL   Urobilinogen, UA 1.0  0.0 - 1.0 mg/dL   Nitrite NEGATIVE  NEGATIVE   Leukocytes, UA NEGATIVE  NEGATIVE  WET PREP, GENITAL      Component Value Range   Yeast Wet Prep HPF POC NONE SEEN  NONE SEEN   Trich, Wet Prep NONE SEEN  NONE SEEN   Clue Cells Wet Prep HPF POC MODERATE (*) NONE SEEN   WBC, Wet Prep HPF POC NONE SEEN  NONE SEEN     Assessment:  The primary encounter diagnosis was Bacterial vaginosis. A diagnosis of Degenerative disc  disease was also pertinent to this visit.  Plan:   1.  The following meds were prescribed:   New Prescriptions   METRONIDAZOLE (FLAGYL) 500 MG TABLET    Take 1 tablet (500 mg total) by mouth 2 (two) times daily.   2.  The patient was instructed in symptomatic care and handouts were given. I suggested that she followup with [facility] orthopedics as had been previously recommended. A urine culture was obtained as well, however since her UA was negative, I doubt that she has a UTI. 3.  The patient was told to return if becoming worse in any way, if no better in 3 or 4 days, and given some red flag symptoms that would indicate earlier return.    Reuben Likes, MD 06/26/12 [phone]

## 2012-06-26 NOTE — ED Notes (Signed)
Pt  Also  Reports  Foul  Smelling  Urine  As well

## 2012-06-26 NOTE — ED Notes (Signed)
Pt  Reports  Pain  l  Leg       And  Groin  Area        Pt  Seen  6  Days  Ago  ucc   Pt  Ambulated  To  Room  With a  Steady  Fluid  Gait  -  Pt is  Sitting  Upright on  Exam table  Speaking in complete  sentances

## 2012-06-27 LAB — GC/CHLAMYDIA PROBE AMP, GENITAL
Chlamydia, DNA Probe: NEGATIVE
GC Probe Amp, Genital: NEGATIVE

## 2012-06-27 LAB — URINE CULTURE
Colony Count: NO GROWTH
Culture: NO GROWTH

## 2012-06-28 NOTE — ED Notes (Signed)
GC/Chlamydia neg., Wet prep: mod. clue cells. Pt. adequately treated with Flagyl. [name] 06/28/2012

## 2012-08-30 ENCOUNTER — Emergency Department (INDEPENDENT_AMBULATORY_CARE_PROVIDER_SITE_OTHER)
Admission: EM | Admit: 2012-08-30 | Discharge: 2012-08-30 | Disposition: A | Discharge status: Home / Self Care | Payer: Self-pay | Source: Home / Self Care | Attending: Emergency Medicine | Admitting: Emergency Medicine

## 2012-08-30 ENCOUNTER — Encounter (HOSPITAL_COMMUNITY): Payer: Self-pay | Admitting: *Deleted

## 2012-08-30 DIAGNOSIS — L0291 Cutaneous abscess, unspecified: Secondary | ICD-10-CM

## 2012-08-30 DIAGNOSIS — J209 Acute bronchitis, unspecified: Secondary | ICD-10-CM

## 2012-08-30 MED ORDER — PREDNISONE 5 MG PO KIT: 1.0000 | PACK | Freq: Every day | ORAL | Status: DC

## 2012-08-30 MED ORDER — SULFAMETHOXAZOLE-TMP DS 800-160 MG PO TABS: 2.0000 | ORAL_TABLET | Freq: Two times a day (BID) | ORAL | Status: DC

## 2012-08-30 MED ORDER — BENZONATATE 200 MG PO CAPS: 200.0000 mg | ORAL_CAPSULE | Freq: Three times a day (TID) | ORAL | Status: DC | PRN

## 2012-08-30 MED ORDER — ALBUTEROL SULFATE HFA 108 (90 BASE) MCG/ACT IN AERS: 1.0000 | INHALATION_SPRAY | Freq: Four times a day (QID) | RESPIRATORY_TRACT | Status: DC | PRN

## 2012-08-30 MED ORDER — AZITHROMYCIN 250 MG PO TABS: ORAL_TABLET | ORAL | Status: DC
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
## 2012-08-30 NOTE — ED Notes (Signed)
C/O chest congestion, postnasal drainage x 3 weeks.  Has been taking Mucinex DM, Robitussin, and Delsym without relief.  Denies fevers.  Also noticed tender lesion to forehead last night.

## 2013-02-21 ENCOUNTER — Emergency Department (INDEPENDENT_AMBULATORY_CARE_PROVIDER_SITE_OTHER)
Admission: EM | Admit: 2013-02-21 | Discharge: 2013-02-21 | Disposition: A | Discharge status: Home / Self Care | Payer: Self-pay | Source: Home / Self Care | Attending: Family Medicine | Admitting: Family Medicine

## 2013-02-21 ENCOUNTER — Emergency Department (INDEPENDENT_AMBULATORY_CARE_PROVIDER_SITE_OTHER): Payer: Self-pay

## 2013-02-21 ENCOUNTER — Encounter (HOSPITAL_COMMUNITY): Payer: Self-pay | Admitting: Emergency Medicine

## 2013-02-21 DIAGNOSIS — R059 Cough, unspecified: Secondary | ICD-10-CM

## 2013-02-21 DIAGNOSIS — J329 Chronic sinusitis, unspecified: Secondary | ICD-10-CM

## 2013-02-21 DIAGNOSIS — R05 Cough: Secondary | ICD-10-CM

## 2013-02-21 MED ORDER — ALBUTEROL SULFATE HFA 108 (90 BASE) MCG/ACT IN AERS: 1.0000 | INHALATION_SPRAY | Freq: Four times a day (QID) | RESPIRATORY_TRACT | Status: DC | PRN

## 2013-02-21 MED ORDER — PREDNISONE 20 MG PO TABS: ORAL_TABLET | ORAL | Status: DC

## 2013-02-21 MED ORDER — FEXOFENADINE HCL 180 MG PO TABS: 180.0000 mg | ORAL_TABLET | Freq: Every day | ORAL | Status: DC

## 2013-02-21 MED ORDER — BENZONATATE 100 MG PO CAPS: 100.0000 mg | ORAL_CAPSULE | Freq: Three times a day (TID) | ORAL | Status: DC

## 2013-02-21 MED ORDER — GUAIFENESIN-CODEINE 100-10 MG/5ML PO SYRP: 5.0000 mL | ORAL_SOLUTION | Freq: Three times a day (TID) | ORAL | Status: DC | PRN

## 2013-02-21 NOTE — ED Provider Notes (Signed)
History    CSN: [id number] Arrival date & time 02/21/13  1716  First MD Initiated Contact with Patient 02/21/13 1833     Chief Complaint  Patient presents with  . Cough   (Consider location/radiation/quality/duration/timing/severity/associated sxs/prior Treatment) HPI Comments: 62 year old female with history of intermittent episodes of cough nonproductive and chest tightness sensation. Symptoms associated with nasal congestion, sinus pressure and headache; symptoms have been present for over one week. Denies fever or chills. Patient also reports chest discomfort  right after she gets in her apartment. No history of unsightly. She has been told her apartment is contaminated with mold. Denies wheezing or shortness of breath. She has had similar symptoms in the past. No history of asthma. Denies ever smoking.   Past Medical History  Diagnosis Date  . Hypertension   . Hyperlipidemia   . Obesity (BMI 30.0-34.9)    Past Surgical History  Procedure Laterality Date  . Colonoscopy  2006   Family History  Problem Relation Age of Onset  . Hypertension Mother   . Hypertension Sister   . Hypertension Maternal Aunt   . Heart disease Maternal Aunt   . Hypertension Maternal Uncle    History  Substance Use Topics  . Smoking status: Never Smoker   . Smokeless tobacco: Not on file  . Alcohol Use: No   OB History   Grav Para Term Preterm Abortions TAB SAB Ect Mult Living                 Review of Systems  Constitutional: Negative for fever, chills, diaphoresis, activity change, appetite change and fatigue.  HENT: Positive for congestion and sneezing.   Respiratory: Positive for cough and wheezing. Negative for shortness of breath.   Cardiovascular: Negative for chest pain, palpitations and leg swelling.  Skin: Negative for rash.  Neurological: Positive for headaches.    Allergies  Penicillins  Home Medications   Current Outpatient Rx  Name  Route  Sig  Dispense  Refill  .  hydrochlorothiazide (HYDRODIURIL) 12.5 MG tablet   Oral   Take 1 tablet (12.5 mg total) by mouth daily.   31 tablet   0   . lisinopril (PRINIVIL,ZESTRIL) 10 MG tablet   Oral   Take 1 tablet (10 mg total) by mouth daily.   31 tablet   0   . albuterol (PROVENTIL HFA;VENTOLIN HFA) 108 (90 BASE) MCG/ACT inhaler   Inhalation   Inhale 1-2 puffs into the lungs every 6 (six) hours as needed for wheezing.   1 Inhaler   0   . Ascorbic Acid (VITAMIN C PO)   Oral   Take by mouth.         Marland Kitchen aspirin 81 MG tablet   Oral   Take 81 mg by mouth daily.         . benzonatate (TESSALON) 100 MG capsule   Oral   Take 1 capsule (100 mg total) by mouth every 8 (eight) hours.   21 capsule   0   . cholecalciferol (VITAMIN D) 1000 UNITS tablet   Oral   Take 1,000 Units by mouth daily.         Marland Kitchen conjugated estrogens (PREMARIN) vaginal cream      Place 1 gram of cream per vagina every other night Pt will request generic for Rx.   42.5 g   12   . fexofenadine (ALLEGRA) 180 MG tablet   Oral   Take 1 tablet (180 mg total) by mouth daily.  30 tablet   0   . fish oil-omega-3 fatty acids 1000 MG capsule   Oral   Take 2 g by mouth daily.         Marland Kitchen guaiFENesin-codeine (ROBITUSSIN AC) 100-10 MG/5ML syrup   Oral   Take 5 mLs by mouth 3 (three) times daily as needed for cough.   120 mL   0   . Multiple Vitamin (MULTIVITAMIN) capsule   Oral   Take 1 capsule by mouth daily.         . predniSONE (DELTASONE) 20 MG tablet      2 tabs by mouth daily for 5 days.   10 tablet   0   . PRESCRIPTION MEDICATION      Pt takes bp med and fluid pill unknown name          BP 158/91  Pulse 78  Temp(Src) 98 F (36.7 C) (Oral)  Resp 18  SpO2 95% Physical Exam  Nursing note and vitals reviewed. Constitutional: She is oriented to person, place, and time. She appears well-developed and well-nourished. No distress.  HENT:  Head: Normocephalic and atraumatic.  Nasal Congestion with  erythema and swelling of nasal turbinates, clear rhinorrhea. mild pharyngeal erythema no exudates. No uvula deviation. No trismus. TM's normal.  Eyes: Conjunctivae are normal. Right eye exhibits no discharge. Left eye exhibits no discharge.  Neck: Neck supple. No JVD present. No thyromegaly present.  Cardiovascular: Normal rate, regular rhythm and normal heart sounds.   Pulmonary/Chest: Effort normal and breath sounds normal. No respiratory distress. She has no wheezes. She has no rales. She exhibits no tenderness.  Impress somehow decreased breast sounds at the bases/ no wheezing.  Lymphadenopathy:    She has no cervical adenopathy.  Neurological: She is alert and oriented to person, place, and time.  Skin: No rash noted. She is not diaphoretic.    ED Course  Procedures (including critical care time) Labs Reviewed - No data to display Dg Chest 2 View  02/21/2013   *RADIOLOGY REPORT*  Clinical Data: Cough, chest pain  CHEST - 2 VIEW  Comparison: None.  Findings: Low lung volumes accentuating the heart size and hilar vascular markings.  No CHF or pneumonia.  No collapse or consolidation.  Negative for effusion or pneumothorax.  Slight rotation to the right.  IMPRESSION: Low volume exam without acute disease   Original Report Authenticated By: Judie Petit. Shick, M.D.   1. Cough   2. Rhinosinusitis     EKG: Normal sinus rhythm with a ventricular rate 73 beats per minutes no ST or other acute ischemic changes. No AV blocks. No bundle-branch blocks.  MDM  Treated with albuterol, Tessalon Perles, fexofenadine, guaifenesin/codeine and prednisone. Supportive care and red flags that should prompt her return to medical attention discussed with patient and provided in writing.  Sharin Grave, MD 02/23/13 [phone]

## 2013-02-21 NOTE — ED Notes (Signed)
Pt c/o dry cough onset 1 week... sxs include headache... Believes it maybe due to mold exposure from her bathroom... Denies: f/v/n, SOB... She is alert and oriented w/no signs of acute distress.

## 2013-07-01 ENCOUNTER — Encounter (HOSPITAL_COMMUNITY): Payer: Self-pay | Admitting: Emergency Medicine

## 2013-07-01 ENCOUNTER — Emergency Department (INDEPENDENT_AMBULATORY_CARE_PROVIDER_SITE_OTHER): Payer: BC Managed Care – PPO

## 2013-07-01 ENCOUNTER — Emergency Department (INDEPENDENT_AMBULATORY_CARE_PROVIDER_SITE_OTHER)
Admission: EM | Admit: 2013-07-01 | Discharge: 2013-07-01 | Disposition: A | Discharge status: Home / Self Care | Payer: BC Managed Care – PPO | Source: Home / Self Care | Attending: Family Medicine | Admitting: Family Medicine

## 2013-07-01 DIAGNOSIS — R059 Cough, unspecified: Secondary | ICD-10-CM

## 2013-07-01 DIAGNOSIS — J329 Chronic sinusitis, unspecified: Secondary | ICD-10-CM

## 2013-07-01 DIAGNOSIS — R05 Cough: Secondary | ICD-10-CM

## 2013-07-01 DIAGNOSIS — M25561 Pain in right knee: Secondary | ICD-10-CM

## 2013-07-01 DIAGNOSIS — M25569 Pain in unspecified knee: Secondary | ICD-10-CM

## 2013-07-01 MED ORDER — METHYLPREDNISOLONE ACETATE 40 MG/ML IJ SUSP: 40.0000 mg | Freq: Once | INTRAMUSCULAR | Status: DC

## 2013-07-01 MED ORDER — TRAMADOL HCL 50 MG PO TABS: 50.0000 mg | ORAL_TABLET | Freq: Four times a day (QID) | ORAL | Status: DC | PRN

## 2013-07-01 MED ORDER — METHYLPREDNISOLONE ACETATE 40 MG/ML IJ SUSP
INTRAMUSCULAR | Status: AC
  Filled 2013-07-01: qty 5

## 2013-07-01 NOTE — ED Provider Notes (Signed)
[name] is a 62 y.o. female who presents to Urgent Care today for 2 months of right knee pain without particular injury. She notes occasional swelling clicking and popping. She denies any locking. She denies any radiating pain weakness numbness. She feels well otherwise. She's tried some over-the-counter pain medications: Been somewhat helpful. She feels well otherwise. Pain is moderate and worse with activity.   Past Medical History  Diagnosis Date  . Hypertension   . Hyperlipidemia   . Obesity (BMI 30.0-34.9)    History  Substance Use Topics  . Smoking status: Never Smoker   . Smokeless tobacco: Not on file  . Alcohol Use: No   ROS as above Medications reviewed. Current Facility-Administered Medications  Medication Dose Route Frequency Provider Last Rate Last Dose  . methylPREDNISolone acetate (DEPO-MEDROL) injection 40 mg  40 mg Intra-articular Once Rodolph Bong, MD       Current Outpatient Prescriptions  Medication Sig Dispense Refill  . Ascorbic Acid (VITAMIN C PO) Take by mouth.      Marland Kitchen aspirin 81 MG tablet Take 81 mg by mouth daily.      . cholecalciferol (VITAMIN D) 1000 UNITS tablet Take 1,000 Units by mouth daily.      . fish oil-omega-3 fatty acids 1000 MG capsule Take 2 g by mouth daily.      . hydrochlorothiazide (HYDRODIURIL) 12.5 MG tablet Take 1 tablet (12.5 mg total) by mouth daily.  31 tablet  0  . lisinopril (PRINIVIL,ZESTRIL) 10 MG tablet Take 1 tablet (10 mg total) by mouth daily.  31 tablet  0  . Multiple Vitamin (MULTIVITAMIN) capsule Take 1 capsule by mouth daily.      Marland Kitchen albuterol (PROVENTIL HFA;VENTOLIN HFA) 108 (90 BASE) MCG/ACT inhaler Inhale 1-2 puffs into the lungs every 6 (six) hours as needed for wheezing.  1 Inhaler  0  . benzonatate (TESSALON) 100 MG capsule Take 1 capsule (100 mg total) by mouth every 8 (eight) hours.  21 capsule  0  . conjugated estrogens (PREMARIN) vaginal cream Place 1 gram of cream per vagina every other night Pt will  request generic for Rx.  42.5 g  12  . fexofenadine (ALLEGRA) 180 MG tablet Take 1 tablet (180 mg total) by mouth daily.  30 tablet  0  . guaiFENesin-codeine (ROBITUSSIN AC) 100-10 MG/5ML syrup Take 5 mLs by mouth 3 (three) times daily as needed for cough.  120 mL  0  . predniSONE (DELTASONE) 20 MG tablet 2 tabs by mouth daily for 5 days.  10 tablet  0  . PRESCRIPTION MEDICATION Pt takes bp med and fluid pill unknown name      . traMADol (ULTRAM) 50 MG tablet Take 1 tablet (50 mg total) by mouth every 6 (six) hours as needed for pain.  15 tablet  0    Exam:  BP 161/85  Pulse 72  Temp(Src) 97.8 F (36.6 C) (Oral)  Resp 16  SpO2 99% Gen: Well NAD RIGHT KNEE: Normal-appearing no significant effusion. Tender palpation medial joint line and posterior. Range of motion -5-120. 1+ retropatellar crepitations on extension Positive medial McMurray's test. Negative Lachman's valgus varus stress  Contralateral left knee is normal-appearing nontender normal motion negative ligamentous testing.  Bilateral lower extremities are nonedematous and normal normal and equal appearing bilaterally. Capillary refill sensation is intact distally bilateral lower extremities.   Knee injection. Right knee Consent obtained and timeout performed. Patient seated in a comfortable position with legs hanging off the table.  The  medial Peri-patellar tendon space was palpated and marked. The skin was then cleaned with alcohol. Cold spray applied. A 22-gauge inch and a half needle was used to inject 40 mg of Depo-Medrol and 4 mL of 0.5% Marcaine. Patient tolerated procedure well no bleeding. Pain improved following injection  No results found for this or any previous visit (from the past 24 hour(s)). Dg Knee Complete 4 Views Right  07/01/2013   CLINICAL DATA:  Right knee pain. No known injury.  EXAM: RIGHT KNEE - COMPLETE 4+ VIEW  COMPARISON:  None.  FINDINGS: There is no evidence of fracture, dislocation, or  joint effusion. There is no evidence of arthropathy or other focal bone abnormality. Soft tissues are unremarkable.  IMPRESSION: Negative.   Electronically Signed   By: Myles Rosenthal M.D.   On: 07/01/2013 14:05    Assessment and Plan: 62 y.o. female with right knee pain. Likely due to medial meniscus injury. Plan: Corticosteroid injection, and tramadol. If not improved followup with Dr. Farris Has at [facility].  Discussed warning signs or symptoms. Please see discharge instructions. Patient expresses understanding.      Rodolph Bong, MD 07/01/13 1455

## 2013-07-01 NOTE — ED Notes (Signed)
Pt reports right knee and posterior lower leg pain the last 2 months   No known injury

## 2013-12-07 ENCOUNTER — Other Ambulatory Visit (HOSPITAL_COMMUNITY): Payer: Self-pay | Admitting: Family Medicine

## 2013-12-07 DIAGNOSIS — Z1231 Encounter for screening mammogram for malignant neoplasm of breast: Secondary | ICD-10-CM

## 2013-12-14 ENCOUNTER — Ambulatory Visit (HOSPITAL_COMMUNITY)
Admission: RE | Admit: 2013-12-14 | Discharge: 2013-12-14 | Disposition: A | Discharge status: Home / Self Care | Payer: No Typology Code available for payment source | Source: Ambulatory Visit | Attending: Family Medicine | Admitting: Family Medicine

## 2013-12-14 DIAGNOSIS — Z1231 Encounter for screening mammogram for malignant neoplasm of breast: Secondary | ICD-10-CM | POA: Insufficient documentation

## 2013-12-17 ENCOUNTER — Emergency Department (HOSPITAL_COMMUNITY): Payer: No Typology Code available for payment source

## 2013-12-17 ENCOUNTER — Emergency Department (INDEPENDENT_AMBULATORY_CARE_PROVIDER_SITE_OTHER)
Admission: EM | Admit: 2013-12-17 | Discharge: 2013-12-17 | Disposition: A | Discharge status: Nursing Home (Includes ICF) | Payer: No Typology Code available for payment source | Source: Home / Self Care

## 2013-12-17 ENCOUNTER — Emergency Department (HOSPITAL_COMMUNITY)
Admission: EM | Admit: 2013-12-17 | Discharge: 2013-12-17 | Disposition: A | Discharge status: Home / Self Care | Payer: No Typology Code available for payment source | Attending: Emergency Medicine | Admitting: Emergency Medicine

## 2013-12-17 ENCOUNTER — Encounter (HOSPITAL_COMMUNITY): Payer: Self-pay | Admitting: Emergency Medicine

## 2013-12-17 ENCOUNTER — Other Ambulatory Visit: Payer: Self-pay

## 2013-12-17 DIAGNOSIS — Z7982 Long term (current) use of aspirin: Secondary | ICD-10-CM | POA: Insufficient documentation

## 2013-12-17 DIAGNOSIS — R0789 Other chest pain: Secondary | ICD-10-CM | POA: Insufficient documentation

## 2013-12-17 DIAGNOSIS — I10 Essential (primary) hypertension: Secondary | ICD-10-CM | POA: Insufficient documentation

## 2013-12-17 DIAGNOSIS — Z880 Allergy status to penicillin: Secondary | ICD-10-CM | POA: Insufficient documentation

## 2013-12-17 DIAGNOSIS — Z79899 Other long term (current) drug therapy: Secondary | ICD-10-CM | POA: Insufficient documentation

## 2013-12-17 DIAGNOSIS — R079 Chest pain, unspecified: Secondary | ICD-10-CM

## 2013-12-17 DIAGNOSIS — E669 Obesity, unspecified: Secondary | ICD-10-CM | POA: Insufficient documentation

## 2013-12-17 LAB — BASIC METABOLIC PANEL
BUN: 16 mg/dL (ref 6–23)
CO2: 29 mEq/L (ref 19–32)
Calcium: 9.7 mg/dL (ref 8.4–10.5)
Chloride: 104 mEq/L (ref 96–112)
Creatinine, Ser: 0.64 mg/dL (ref 0.50–1.10)
GFR calc Af Amer: >90 mL/min (ref >90)
GFR calc non Af Amer: >90 mL/min (ref >90)
Glucose, Bld: 94 mg/dL (ref 70–99)
Potassium: 4 mEq/L (ref 3.7–5.3)
Sodium: 146 mEq/L (ref 137–147)

## 2013-12-17 LAB — CBC WITH DIFFERENTIAL/PLATELET
Basophils Absolute: 0 10*3/uL (ref 0.0–0.1)
Basophils Relative: 0 % (ref 0–1)
Eosinophils Absolute: 0 10*3/uL (ref 0.0–0.7)
Eosinophils Relative: 1 % (ref 0–5)
HCT: 37.7 % (ref 36.0–46.0)
Hemoglobin: 12.3 g/dL (ref 12.0–15.0)
Lymphocytes Relative: 40 % (ref 12–46)
Lymphs Abs: 2.4 10*3/uL (ref 0.7–4.0)
MCH: 29.1 pg (ref 26.0–34.0)
MCHC: 32.6 g/dL (ref 30.0–36.0)
MCV: 89.1 fL (ref 78.0–100.0)
Monocytes Absolute: 0.4 10*3/uL (ref 0.1–1.0)
Monocytes Relative: 6 % (ref 3–12)
Neutro Abs: 3.2 10*3/uL (ref 1.7–7.7)
Neutrophils Relative %: 53 % (ref 43–77)
Platelets: 279 10*3/uL (ref 150–400)
RBC: 4.23 MIL/uL (ref 3.87–5.11)
RDW: 14.5 % (ref 11.5–15.5)
WBC: 6.1 10*3/uL (ref 4.0–10.5)

## 2013-12-17 LAB — TROPONIN I: Troponin I: <0.3 ng/mL (ref <0.30)

## 2013-12-17 MED ORDER — NITROGLYCERIN 0.4 MG SL SUBL
0.4000 mg | SUBLINGUAL_TABLET | SUBLINGUAL | Status: DC | PRN
  Administered 2013-12-17: 0.4 mg via SUBLINGUAL

## 2013-12-17 MED ORDER — SODIUM CHLORIDE 0.9 % IV SOLN
Freq: Once | INTRAVENOUS | Status: AC
  Administered 2013-12-17: 15:00:00 via INTRAVENOUS

## 2013-12-17 MED ORDER — NITROGLYCERIN 0.4 MG SL SUBL
SUBLINGUAL_TABLET | SUBLINGUAL | Status: AC
  Filled 2013-12-17: qty 1

## 2013-12-17 NOTE — ED Provider Notes (Addendum)
CSN: [id number]     Arrival date & time 12/17/13  49 History   First MD Initiated Contact with Patient 12/17/13 1610     Chief Complaint  Patient presents with  . Chest Pain      HPI Patient reports constant chest pain through the weekend.  At times the chest tightness becomes worse but the majority of the time it is constant.  She has no associated shortness of breath.  She states she is intermittently had chest discomfort and productive cough since moving out of the house with mold one year ago.  She has no prior cardiac history.  She does have a history of hyperlipidemia and hypertension.  She has a family history of cardiac disease.  She does not have diabetes or smoke cigarettes.  She's had no exertional chest pain or shortness of breath.  No prior stress test or heart catheterization.   Past Medical History  Diagnosis Date  . Hypertension   . Hyperlipidemia   . Obesity (BMI 30.0-34.9)    Past Surgical History  Procedure Laterality Date  . Colonoscopy  2006   Family History  Problem Relation Age of Onset  . Hypertension Mother   . Hypertension Sister   . Hypertension Maternal Aunt   . Heart disease Maternal Aunt   . Hypertension Maternal Uncle    History  Substance Use Topics  . Smoking status: Never Smoker   . Smokeless tobacco: Not on file  . Alcohol Use: No   OB History   Grav Para Term Preterm Abortions TAB SAB Ect Mult Living                 Review of Systems  All other systems reviewed and are negative.     Allergies  Penicillins  Home Medications   Prior to Admission medications   Medication Sig Start Date End Date Taking? Authorizing Provider  Ascorbic Acid (VITAMIN C PO) Take by mouth.   Yes Historical Provider, MD  aspirin 81 MG tablet Take 81 mg by mouth daily.   Yes Historical Provider, MD  BIOTIN PO Take 1 tablet by mouth daily.   Yes Historical Provider, MD  cholecalciferol (VITAMIN D) 1000 UNITS tablet Take 1,000 Units by mouth daily.    Yes Historical Provider, MD  hydrochlorothiazide (HYDRODIURIL) 12.5 MG tablet Take 1 tablet (12.5 mg total) by mouth daily. 01/13/12  Yes Myra Marijo Sanes, MD  lisinopril (PRINIVIL,ZESTRIL) 10 MG tablet Take 1 tablet (10 mg total) by mouth daily. 01/13/12  Yes Emily Filbert, MD  Multiple Vitamin (MULTIVITAMIN) capsule Take 1 capsule by mouth daily.   Yes Historical Provider, MD   BP 123/70  Pulse 75  Temp(Src) 98 F (36.7 C) (Oral)  Resp 23  SpO2 96% Physical Exam  Nursing note and vitals reviewed. Constitutional: She is oriented to person, place, and time. She appears well-developed and well-nourished. No distress.  HENT:  Head: Normocephalic and atraumatic.  Eyes: EOM are normal.  Neck: Normal range of motion.  Cardiovascular: Normal rate, regular rhythm and normal heart sounds.   Pulmonary/Chest: Effort normal and breath sounds normal.  Abdominal: Soft. She exhibits no distension. There is no tenderness.  Musculoskeletal: Normal range of motion.  Neurological: She is alert and oriented to person, place, and time.  Skin: Skin is warm and dry.  Psychiatric: She has a normal mood and affect. Judgment normal.    ED Course  Procedures (including critical care time) Labs Review Labs Reviewed  CBC WITH DIFFERENTIAL  BASIC METABOLIC PANEL  TROPONIN I    Imaging Review Dg Chest 2 View  12/17/2013   CLINICAL DATA:  Chest pain  EXAM: CHEST  2 VIEW  COMPARISON:  02/21/2013  FINDINGS: Cardiomediastinal silhouette is stable. No acute infiltrate or pleural effusion. No pulmonary edema. Mild degenerative changes thoracic spine.  IMPRESSION: No active cardiopulmonary disease.   Electronically Signed   By: Lahoma Crocker M.D.   On: 12/17/2013 17:32    ECG interpretation   Date: 12/17/2013  Rate: 78  Rhythm: normal sinus rhythm  QRS Axis: normal  Intervals: normal  ST/T Wave abnormalities: normal  Conduction Disutrbances: none  Narrative Interpretation:   Old EKG Reviewed: No significant  changes noted     MDM   Final diagnoses:  Chest pain    Atypical chest pain.  Followup with PCP as well as cardiology.  Outpatient testing may be completed.  No additional need for testing today in the emergency department.  Doubt ACS.  Doubt PE.  Discharge home chest pain-free.    Hoy Morn, MD 12/17/13 [place], MD 12/17/13 1900

## 2013-12-17 NOTE — ED Notes (Addendum)
Taken to xray.

## 2013-12-17 NOTE — ED Provider Notes (Signed)
CSN: [id number]     Arrival date & time 12/17/13  1408 History   First MD Initiated Contact with Patient 12/17/13 1423     Chief Complaint  Patient presents with  . Chest Pain   (Consider location/radiation/quality/duration/timing/severity/associated sxs/prior Treatment) HPI Comments: 63 year old female began to experience mid anterior chest tightness over the weekend. The tightness was worse over the weekend it is today. It is constant but waxes and wanes. It does not radiate. It is not associated with dyspnea, diaphoresis or GI symptoms. Risk factors include dyslipidemia and hypertension. She is currently having mild chest tightness.   Past Medical History  Diagnosis Date  . Hypertension   . Hyperlipidemia   . Obesity (BMI 30.0-34.9)    Past Surgical History  Procedure Laterality Date  . Colonoscopy  2006   Family History  Problem Relation Age of Onset  . Hypertension Mother   . Hypertension Sister   . Hypertension Maternal Aunt   . Heart disease Maternal Aunt   . Hypertension Maternal Uncle    History  Substance Use Topics  . Smoking status: Never Smoker   . Smokeless tobacco: Not on file  . Alcohol Use: No   OB History   Grav Para Term Preterm Abortions TAB SAB Ect Mult Living                 Review of Systems  Constitutional: Positive for activity change. Negative for fever, diaphoresis and fatigue.  HENT: Negative.   Respiratory: Positive for chest tightness. Negative for cough, choking and shortness of breath.   Cardiovascular: Positive for chest pain. Negative for palpitations and leg swelling.  Gastrointestinal: Negative.   Genitourinary: Negative.   Musculoskeletal: Negative.   Neurological: Negative.   Psychiatric/Behavioral: Negative.     Allergies  Penicillins  Home Medications   Prior to Admission medications   Medication Sig Start Date End Date Taking? Authorizing Provider  hydrochlorothiazide (HYDRODIURIL) 12.5 MG tablet Take 1 tablet (12.5  mg total) by mouth daily. 01/13/12  Yes Myra Marijo Sanes, MD  lisinopril (PRINIVIL,ZESTRIL) 10 MG tablet Take 1 tablet (10 mg total) by mouth daily. 01/13/12  Yes Myra Marijo Sanes, MD  albuterol (PROVENTIL HFA;VENTOLIN HFA) 108 (90 BASE) MCG/ACT inhaler Inhale 1-2 puffs into the lungs every 6 (six) hours as needed for wheezing. 02/21/13   Leonette Monarch Moreno-Coll, MD  Ascorbic Acid (VITAMIN C PO) Take by mouth.    Historical Provider, MD  aspirin 81 MG tablet Take 81 mg by mouth daily.    Historical Provider, MD  benzonatate (TESSALON) 100 MG capsule Take 1 capsule (100 mg total) by mouth every 8 (eight) hours. 02/21/13   Adlih Moreno-Coll, MD  cholecalciferol (VITAMIN D) 1000 UNITS tablet Take 1,000 Units by mouth daily.    Historical Provider, MD  conjugated estrogens (PREMARIN) vaginal cream Place 1 gram of cream per vagina every other night Pt will request generic for Rx. 03/15/12   Emily Filbert, MD  fexofenadine (ALLEGRA) 180 MG tablet Take 1 tablet (180 mg total) by mouth daily. 02/21/13   Adlih Moreno-Coll, MD  fish oil-omega-3 fatty acids 1000 MG capsule Take 2 g by mouth daily.    Historical Provider, MD  guaiFENesin-codeine (ROBITUSSIN AC) 100-10 MG/5ML syrup Take 5 mLs by mouth 3 (three) times daily as needed for cough. 02/21/13   Adlih Moreno-Coll, MD  Multiple Vitamin (MULTIVITAMIN) capsule Take 1 capsule by mouth daily.    Historical Provider, MD  predniSONE (DELTASONE) 20 MG tablet 2 tabs by mouth  daily for 5 days. 02/21/13   Adlih Moreno-Coll, MD  PRESCRIPTION MEDICATION Pt takes bp med and fluid pill unknown name    Historical Provider, MD  traMADol (ULTRAM) 50 MG tablet Take 1 tablet (50 mg total) by mouth every 6 (six) hours as needed for pain. 07/01/13   Gregor Hams, MD   BP 145/91  Pulse 92  Temp(Src) 98.1 F (36.7 C) (Oral)  Resp 18  SpO2 97% Physical Exam  Nursing note and vitals reviewed. Constitutional: She is oriented to person, place, and time. She appears well-developed and well-nourished.  No distress.  HENT:  Mouth/Throat: No oropharyngeal exudate.  bilat TM's nl OP with PND  Eyes: Conjunctivae and EOM are normal.  Neck: Normal range of motion. Neck supple.  Cardiovascular: Normal rate, regular rhythm and normal heart sounds.   Pulmonary/Chest: Effort normal and breath sounds normal. No respiratory distress. She has no wheezes. She has no rales.  No chest wall tenderness.   Musculoskeletal: She exhibits no edema.  Lymphadenopathy:    She has no cervical adenopathy.  Neurological: She is alert and oriented to person, place, and time. She exhibits normal muscle tone.  Skin: Skin is warm and dry. No rash noted.  Psychiatric: She has a normal mood and affect.  Relaxed posturing. No distress    ED Course  Procedures (including critical care time) Labs Review Labs Reviewed - No data to display  Imaging Review No results found. EKG: NSR. No ectopy. Nonspecific ST-T wave abnormalities most consistent with early repol in V1,V2.   MDM   1. Chest pain     NTG decreased her tightness.  Pt will be transferred to [facility] ED via EMS for evaluation of chest tightness.   Janne Napoleon, NP 12/17/13 [id number]

## 2013-12-17 NOTE — ED Notes (Signed)
Pain  Scale of  2  Prior  To the  Sl  nitroglycerin

## 2013-12-17 NOTE — ED Notes (Signed)
Placed  On  Cardiac  Monitor   Nasal  o2  At  2  l  /  Min  

## 2013-12-17 NOTE — ED Notes (Addendum)
Pt to department via EMS-pt reports that she has had chest tightness over the past weekend. Pt went to [facility] and transferred here for further evaluation. Pt is pain free at this time. 18g LAC from [facility]. Bp-148/90 Hr-94 Pt had EKG done at [facility]

## 2013-12-17 NOTE — ED Notes (Addendum)
Pt c/o intermittent chest pain onset 4 days Sx also include HA Hx of HTN Denies weakness, SOB, diaphoresis, f/v/n/d Alert and talking in complete sentences w/no signs of acute distress

## 2013-12-17 NOTE — Discharge Instructions (Signed)
Chest Pain (Nonspecific) °It is often hard to give a specific diagnosis for the cause of chest pain. There is always a chance that your pain could be related to something serious, such as a heart attack or a blood clot in the lungs. You need to follow up with your caregiver for further evaluation. °CAUSES  °· Heartburn. °· Pneumonia or bronchitis. °· Anxiety or stress. °· Inflammation around your heart (pericarditis) or lung (pleuritis or pleurisy). °· A blood clot in the lung. °· A collapsed lung (pneumothorax). It can develop suddenly on its own (spontaneous pneumothorax) or from injury (trauma) to the chest. °· Shingles infection (herpes zoster virus). °The chest wall is composed of bones, muscles, and cartilage. Any of these can be the source of the pain. °· The bones can be bruised by injury. °· The muscles or cartilage can be strained by coughing or overwork. °· The cartilage can be affected by inflammation and become sore (costochondritis). °DIAGNOSIS  °Lab tests or other studies, such as X-rays, electrocardiography, stress testing, or cardiac imaging, may be needed to find the cause of your pain.  °TREATMENT  °· Treatment depends on what may be causing your chest pain. Treatment may include: °· Acid blockers for heartburn. °· Anti-inflammatory medicine. °· Pain medicine for inflammatory conditions. °· Antibiotics if an infection is present. °· You may be advised to change lifestyle habits. This includes stopping smoking and avoiding alcohol, caffeine, and chocolate. °· You may be advised to keep your head raised (elevated) when sleeping. This reduces the chance of acid going backward from your stomach into your esophagus. °· Most of the time, nonspecific chest pain will improve within 2 to 3 days with rest and mild pain medicine. °HOME CARE INSTRUCTIONS  °· If antibiotics were prescribed, take your antibiotics as directed. Finish them even if you start to feel better. °· For the next few days, avoid physical  activities that bring on chest pain. Continue physical activities as directed. °· Do not smoke. °· Avoid drinking alcohol. °· Only take over-the-counter or prescription medicine for pain, discomfort, or fever as directed by your caregiver. °· Follow your caregiver's suggestions for further testing if your chest pain does not go away. °· Keep any follow-up appointments you made. If you do not go to an appointment, you could develop lasting (chronic) problems with pain. If there is any problem keeping an appointment, you must call to reschedule. °SEEK MEDICAL CARE IF:  °· You think you are having problems from the medicine you are taking. Read your medicine instructions carefully. °· Your chest pain does not go away, even after treatment. °· You develop a rash with blisters on your chest. °SEEK IMMEDIATE MEDICAL CARE IF:  °· You have increased chest pain or pain that spreads to your arm, neck, jaw, back, or abdomen. °· You develop shortness of breath, an increasing cough, or you are coughing up blood. °· You have severe back or abdominal pain, feel nauseous, or vomit. °· You develop severe weakness, fainting, or chills. °· You have a fever. °THIS IS AN EMERGENCY. Do not wait to see if the pain will go away. Get medical help at once. Call your local emergency services (911 in [place] Do not drive yourself to the hospital. °MAKE SURE YOU:  °· Understand these instructions. °· Will watch your condition. °· Will get help right away if you are not doing well or get worse. °Document Released: 05/26/2005 Document Revised: 11/08/2011 Document Reviewed: 03/21/2008 °ExitCare® Patient Information ©2014 [employer]   [employer]. ° °

## 2013-12-19 ENCOUNTER — Encounter (HOSPITAL_COMMUNITY): Payer: Self-pay | Admitting: Emergency Medicine

## 2013-12-19 ENCOUNTER — Other Ambulatory Visit: Payer: Self-pay | Admitting: Family Medicine

## 2013-12-19 DIAGNOSIS — R928 Other abnormal and inconclusive findings on diagnostic imaging of breast: Secondary | ICD-10-CM

## 2013-12-19 NOTE — ED Provider Notes (Signed)
Medical screening examination/treatment/procedure(s) were performed by a resident physician or non-physician practitioner and as the supervising physician I was immediately available for consultation/collaboration.  Twyla Dais, MD    Eun Vermeer S Braeley Buskey, MD 12/19/13 1324 

## 2013-12-26 ENCOUNTER — Ambulatory Visit
Admission: RE | Admit: 2013-12-26 | Discharge: 2013-12-26 | Disposition: A | Discharge status: Home / Self Care | Payer: No Typology Code available for payment source | Source: Ambulatory Visit | Attending: Family Medicine | Admitting: Family Medicine

## 2013-12-26 DIAGNOSIS — R928 Other abnormal and inconclusive findings on diagnostic imaging of breast: Secondary | ICD-10-CM

## 2014-03-27 ENCOUNTER — Encounter: Payer: Self-pay | Admitting: Internal Medicine

## 2014-07-26 ENCOUNTER — Encounter (HOSPITAL_COMMUNITY): Payer: Self-pay | Admitting: Adult Health

## 2014-07-26 ENCOUNTER — Emergency Department (HOSPITAL_COMMUNITY)
Admission: EM | Admit: 2014-07-26 | Discharge: 2014-07-27 | Disposition: A | Discharge status: Home / Self Care | Payer: No Typology Code available for payment source | Attending: Emergency Medicine | Admitting: Emergency Medicine

## 2014-07-26 DIAGNOSIS — E669 Obesity, unspecified: Secondary | ICD-10-CM | POA: Diagnosis not present

## 2014-07-26 DIAGNOSIS — Z79899 Other long term (current) drug therapy: Secondary | ICD-10-CM | POA: Diagnosis not present

## 2014-07-26 DIAGNOSIS — Y9289 Other specified places as the place of occurrence of the external cause: Secondary | ICD-10-CM | POA: Insufficient documentation

## 2014-07-26 DIAGNOSIS — W208XXA Other cause of strike by thrown, projected or falling object, initial encounter: Secondary | ICD-10-CM | POA: Insufficient documentation

## 2014-07-26 DIAGNOSIS — Y990 Civilian activity done for income or pay: Secondary | ICD-10-CM | POA: Insufficient documentation

## 2014-07-26 DIAGNOSIS — Z7982 Long term (current) use of aspirin: Secondary | ICD-10-CM | POA: Diagnosis not present

## 2014-07-26 DIAGNOSIS — S0990XA Unspecified injury of head, initial encounter: Secondary | ICD-10-CM | POA: Diagnosis present

## 2014-07-26 DIAGNOSIS — I10 Essential (primary) hypertension: Secondary | ICD-10-CM | POA: Insufficient documentation

## 2014-07-26 DIAGNOSIS — Y9389 Activity, other specified: Secondary | ICD-10-CM | POA: Diagnosis not present

## 2014-07-26 DIAGNOSIS — Z8639 Personal history of other endocrine, nutritional and metabolic disease: Secondary | ICD-10-CM | POA: Insufficient documentation

## 2014-07-26 DIAGNOSIS — Z880 Allergy status to penicillin: Secondary | ICD-10-CM | POA: Insufficient documentation

## 2014-07-26 DIAGNOSIS — S0101XA Laceration without foreign body of scalp, initial encounter: Secondary | ICD-10-CM | POA: Insufficient documentation

## 2014-07-26 MED ORDER — LIDOCAINE-EPINEPHRINE 1 %-1:100000 IJ SOLN
20.0000 mL | Freq: Once | INTRAMUSCULAR | Status: AC
  Administered 2014-07-26: 20 mL
  Filled 2014-07-26: qty 1

## 2014-07-26 NOTE — ED Provider Notes (Signed)
CSN: [id number]     Arrival date & time 07/26/14  2244 History  This chart was scribed for non-physician practitioner working with Pamella Pert, MD by Mercy Moore, ED Scribe. This patient was seen in room TR09C/TR09C and the patient's care was started at 11:28 PM.   Chief Complaint  Patient presents with  . Head Injury   The history is provided by the patient. No language interpreter was used.   HPI Comments: [name] is a 63 y.o. female who presents to the Emergency Department with frontal head laceration incurred two hours ago while at work. Patient reports that a decorative object fell from a shelf and struck her head. Height of the shelf was just above her head, so the object didn't descend far before impact. Patient denies loss of consciousness resulting from the hit. Patient reports pain at the site and bleeding, which is controlled at evaluation. Patient has not taken any pain medication. Patient denies nausea, neck pain, vomiting, or trouble ambulating. Patient is not on anticoagulants. Tetanus UTD.    Past Medical History  Diagnosis Date  . Hypertension   . Hyperlipidemia   . Obesity (BMI 30.0-34.9)    Past Surgical History  Procedure Laterality Date  . Colonoscopy  2006   History  Substance Use Topics  . Smoking status: Never Smoker   . Smokeless tobacco: Not on file  . Alcohol Use: No   OB History    No data available     Review of Systems  Constitutional: Negative for fever, chills and fatigue.  HENT: Negative for tinnitus.   Eyes: Negative for photophobia, pain and visual disturbance.  Respiratory: Negative for shortness of breath.   Cardiovascular: Negative for chest pain.  Gastrointestinal: Negative for nausea and vomiting.  Musculoskeletal: Negative for back pain, gait problem and neck pain.  Skin: Positive for wound.       Laceration   Neurological: Positive for headaches. Negative for dizziness, weakness, light-headedness and numbness.   Psychiatric/Behavioral: Negative for confusion and decreased concentration.    Allergies  Penicillins  Home Medications   Prior to Admission medications   Medication Sig Start Date End Date Taking? Authorizing Provider  Ascorbic Acid (VITAMIN C PO) Take by mouth.    Historical Provider, MD  aspirin 81 MG tablet Take 81 mg by mouth daily.    Historical Provider, MD  BIOTIN PO Take 1 tablet by mouth daily.    Historical Provider, MD  cholecalciferol (VITAMIN D) 1000 UNITS tablet Take 1,000 Units by mouth daily.    Historical Provider, MD  hydrochlorothiazide (HYDRODIURIL) 12.5 MG tablet Take 1 tablet (12.5 mg total) by mouth daily. 01/13/12   Emily Filbert, MD  lisinopril (PRINIVIL,ZESTRIL) 10 MG tablet Take 1 tablet (10 mg total) by mouth daily. 01/13/12   Emily Filbert, MD  Multiple Vitamin (MULTIVITAMIN) capsule Take 1 capsule by mouth daily.    Historical Provider, MD   Triage Vitals: BP 172/92 mmHg  Pulse 86  Temp(Src) 98 F (36.7 C) (Oral)  Resp 18  Ht 5\' 3"  (1.6 m)  Wt 165 lb (74.844 kg)  BMI 29.24 kg/m2  SpO2 95% Physical Exam  Constitutional: She is oriented to person, place, and time. She appears well-developed and well-nourished. No distress.  HENT:  Head: Normocephalic and atraumatic. Head is without raccoon's eyes and without Battle's sign.  Right Ear: Tympanic membrane, external ear and ear canal normal. No hemotympanum.  Left Ear: Tympanic membrane, external ear and ear canal normal. No hemotympanum.  Nose: Nose normal. No nasal septal hematoma.  Mouth/Throat: Uvula is midline, oropharynx is clear and moist and mucous membranes are normal.  Eyes: Conjunctivae, EOM and lids are normal. Pupils are equal, round, and reactive to light. Right eye exhibits no nystagmus. Left eye exhibits no nystagmus.  No visible hyphema noted  Neck: Normal range of motion. Neck supple. No tracheal deviation present.  Cardiovascular: Normal rate and regular rhythm.   Pulmonary/Chest: Effort  normal and breath sounds normal. No respiratory distress.  Abdominal: Soft. There is no tenderness.  Musculoskeletal: Normal range of motion.       Cervical back: She exhibits normal range of motion, no tenderness and no bony tenderness.       Thoracic back: She exhibits no tenderness and no bony tenderness.       Lumbar back: She exhibits no tenderness and no bony tenderness.  Neurological: She is alert and oriented to person, place, and time. She has normal strength and normal reflexes. No cranial nerve deficit or sensory deficit. Coordination normal. GCS eye subscore is 4. GCS verbal subscore is 5. GCS motor subscore is 6.  Skin: Skin is warm and dry.  There is a 3 cm, linear, mildly gaping laceration to the patient's frontal scalp. No active bleeding. Wound is partial thickness.  Psychiatric: She has a normal mood and affect. Her behavior is normal.  Nursing note and vitals reviewed.   ED Course  Procedures (including critical care time)  COORDINATION OF CARE: 11:28 PM- Discussed treatment plan with patient at bedside and patient agreed to plan.   Labs Review Labs Reviewed - No data to display  Imaging Review No results found.   EKG Interpretation None       LACERATION REPAIR Performed by: Efrain Sella Brixius PA-S2 under my supervision Authorized by: Faustino Congress Consent: Verbal consent obtained. Risks and benefits: risks, benefits and alternatives were discussed Consent given by: patient Patient identity confirmed: provided demographic data Prepped and Draped in normal sterile fashion Wound explored  Laceration Location: frontal scalp  Laceration Length: 3cm  No Foreign Bodies seen or palpated  Anesthesia: local infiltration  Local anesthetic: lidocaine 2% with epinephrine  Anesthetic total: 5 ml  Irrigation method: skin scrub with dermal cleanser Amount of cleaning: standard  Skin closure: 6-0 Prolene  Number of sutures: 4  Technique:  simple interrupted  Patient tolerance: Patient tolerated the procedure well with no immediate complications.    Patient counseled on wound care. Patient counseled on need to return or see PCP/urgent care for suture removal in 5 days. Patient was urged to return to the Emergency Department urgently with worsening pain, swelling, expanding erythema especially if it streaks away from the affected area, fever, or if they have any other concerns. Patient verbalized understanding.   Neurological exam stable while in the ED. Patient was counseled on head injury precautions and symptoms that should indicate their return to the ED.  These include severe worsening headache, vision changes, confusion, loss of consciousness, trouble walking, nausea & vomiting, or weakness/tingling in extremities.        MDM   Final diagnoses:  Minor head injury, initial encounter  Scalp laceration, initial encounter   Minor head injury: Do not suspect closed head injury. Normal neurological exam. Patient is not on blood thinners. No neck pain. No indication for advanced imaging at this time. Stable while in ED.  Scalp laceration: Repaired without complication.  I personally performed the services described in this documentation, which was scribed  in my presence. The recorded information has been reviewed and is accurate.    Carlisle Cater, PA-C 07/27/14 0155  Pamella Pert, MD 07/27/14 [phone]

## 2014-07-26 NOTE — ED Notes (Signed)
Pt reports a small bench fell on her head while at work. Small laceration noted to central forehead. Denies LOC. Pt does not take blood thinners. Bleeding controlled at this time

## 2014-07-26 NOTE — ED Notes (Signed)
Resents with laceration to top of head from an item that fell on top her head, denies LOC, denies nausea, denies neck pain. Pt has 1-1.5 inch laceration to forehead. Denies blood thinner use. Alert and oriented. MAE x4.

## 2014-07-26 NOTE — ED Notes (Addendum)
PA student at bedside for suturing

## 2014-07-27 NOTE — Discharge Instructions (Signed)
Please read and follow all provided instructions.  Your diagnoses today include:  1. Minor head injury, initial encounter   2. Scalp laceration, initial encounter     Tests performed today include:  Vital signs. See below for your results today.   Medications prescribed:   None  Take any prescribed medications only as directed.  Home care instructions:  Follow any educational materials contained in this packet.  BE VERY CAREFUL not to take multiple medicines containing Tylenol (also called acetaminophen). Doing so can lead to an overdose which can damage your liver and cause liver failure and possibly death.   Follow-up instructions: Please follow-up with your primary care provider in the next 3 days for further evaluation of your symptoms.   Return instructions:  SEEK IMMEDIATE MEDICAL ATTENTION IF:  There is confusion or drowsiness (although children frequently become drowsy after injury).   You cannot awaken the injured person.   You have more than one episode of vomiting.   You notice dizziness or unsteadiness which is getting worse, or inability to walk.   You have convulsions or unconsciousness.   You experience severe, persistent headaches not relieved by Tylenol.  You cannot use arms or legs normally.   There are changes in pupil sizes. (This is the black center in the colored part of the eye)   There is clear or bloody discharge from the nose or ears.   You have change in speech, vision, swallowing, or understanding.   Localized weakness, numbness, tingling, or change in bowel or bladder control.  You have any other emergent concerns.  Additional Information: You have had a head injury which does not appear to require admission at this time.  Your vital signs today were: BP 172/92 mmHg   Pulse 86   Temp(Src) 98 F (36.7 C) (Oral)   Resp 18   Ht 5\' 3"  (1.6 m)   Wt 165 lb (74.844 kg)   BMI 29.24 kg/m2   SpO2 95% If your blood pressure (BP) was elevated  above 135/85 this visit, please have this repeated by your doctor within one month. --------------   Follow-up instructions: Suture Removal: Return to the Emergency Department or see your primary care care doctor in 5 days for a recheck of your wound and removal of your sutures or staples.    Return instructions:  Return to the Emergency Department if you have:  Fever  Worsening pain  Worsening swelling of the wound  Pus draining from the wound  Redness of the skin that moves away from the wound, especially if it streaks away from the affected area   Any other emergent concerns

## 2014-08-02 ENCOUNTER — Encounter (HOSPITAL_COMMUNITY): Payer: Self-pay | Admitting: *Deleted

## 2014-08-02 ENCOUNTER — Emergency Department (HOSPITAL_COMMUNITY)
Admission: EM | Admit: 2014-08-02 | Discharge: 2014-08-02 | Disposition: A | Discharge status: Home / Self Care | Payer: No Typology Code available for payment source | Attending: Emergency Medicine | Admitting: Emergency Medicine

## 2014-08-02 DIAGNOSIS — Z880 Allergy status to penicillin: Secondary | ICD-10-CM | POA: Insufficient documentation

## 2014-08-02 DIAGNOSIS — Z7982 Long term (current) use of aspirin: Secondary | ICD-10-CM | POA: Diagnosis not present

## 2014-08-02 DIAGNOSIS — Z79899 Other long term (current) drug therapy: Secondary | ICD-10-CM | POA: Insufficient documentation

## 2014-08-02 DIAGNOSIS — E669 Obesity, unspecified: Secondary | ICD-10-CM | POA: Diagnosis not present

## 2014-08-02 DIAGNOSIS — I10 Essential (primary) hypertension: Secondary | ICD-10-CM | POA: Diagnosis not present

## 2014-08-02 DIAGNOSIS — Z4802 Encounter for removal of sutures: Secondary | ICD-10-CM | POA: Diagnosis not present

## 2014-08-02 NOTE — ED Notes (Signed)
Lisa, PA at bedside

## 2014-08-02 NOTE — ED Notes (Signed)
Pt here to have sutures removed

## 2014-08-02 NOTE — Discharge Instructions (Signed)
Keep wound clean and dry. Once scabbing has resolved, may wish to start applying Mederma cream to help reduce scarring.  This is available over the counter. Return to the ED for new concerns.

## 2014-08-02 NOTE — ED Provider Notes (Signed)
CSN: [id number]     Arrival date & time 08/02/14  4098 History   First MD Initiated Contact with Patient 08/02/14 [phone]     Chief Complaint  Patient presents with  . Suture / Staple Removal     (Consider location/radiation/quality/duration/timing/severity/associated sxs/prior Treatment) Patient is a 63 y.o. female presenting with suture removal. The history is provided by the patient and medical records.  Suture / Staple Removal    This is a 63 y.o. F with PMH significant for HTN, HLP, obesity, presenting to the ED for suture removal.  Patient had them placed approx 6 days ago in the ED after an object fell off of a shelf and hit her head.  States wound has been healing well.  No drainage, fever, headaches, dizziness.    Past Medical History  Diagnosis Date  . Hypertension   . Hyperlipidemia   . Obesity (BMI 30.0-34.9)    Past Surgical History  Procedure Laterality Date  . Colonoscopy  2006   Family History  Problem Relation Age of Onset  . Hypertension Mother   . Hypertension Sister   . Hypertension Maternal Aunt   . Heart disease Maternal Aunt   . Hypertension Maternal Uncle    History  Substance Use Topics  . Smoking status: Never Smoker   . Smokeless tobacco: Not on file  . Alcohol Use: No   OB History    No data available     Review of Systems  Constitutional:       Suture removal  All other systems reviewed and are negative.     Allergies  Penicillins  Home Medications   Prior to Admission medications   Medication Sig Start Date End Date Taking? Authorizing Provider  Ascorbic Acid (VITAMIN C PO) Take by mouth.    Historical Provider, MD  aspirin 81 MG tablet Take 81 mg by mouth daily.    Historical Provider, MD  BIOTIN PO Take 1 tablet by mouth daily.    Historical Provider, MD  cholecalciferol (VITAMIN D) 1000 UNITS tablet Take 1,000 Units by mouth daily.    Historical Provider, MD  hydrochlorothiazide (HYDRODIURIL) 12.5 MG tablet Take 1 tablet  (12.5 mg total) by mouth daily. 01/13/12   Emily Filbert, MD  lisinopril (PRINIVIL,ZESTRIL) 10 MG tablet Take 1 tablet (10 mg total) by mouth daily. 01/13/12   Emily Filbert, MD  Multiple Vitamin (MULTIVITAMIN) capsule Take 1 capsule by mouth daily.    Historical Provider, MD   BP 168/81 mmHg  Pulse 67  Temp(Src) 97.6 F (36.4 C) (Oral)  Resp 16  SpO2 100%   Physical Exam  Constitutional: She is oriented to person, place, and time. She appears well-developed and well-nourished.  HENT:  Head: Normocephalic and atraumatic.  Mouth/Throat: Oropharynx is clear and moist.  4 prolene sutures in place left forehead, laceration is well healed; no surrounding swelling, erythema, induration, drainage, or other signs of infection  Eyes: Conjunctivae and EOM are normal. Pupils are equal, round, and reactive to light.  Neck: Normal range of motion.  Cardiovascular: Normal rate, regular rhythm and normal heart sounds.   Pulmonary/Chest: Effort normal and breath sounds normal.  Abdominal: Soft. Bowel sounds are normal.  Musculoskeletal: Normal range of motion.  Neurological: She is alert and oriented to person, place, and time.  Skin: Skin is warm and dry.  Psychiatric: She has a normal mood and affect.  Nursing note and vitals reviewed.   ED Course  SUTURE REMOVAL Date/Time: 08/02/2014 6:53 AM  Performed by: Larene Pickett Authorized by: Larene Pickett Consent: Verbal consent obtained. Risks and benefits: risks, benefits and alternatives were discussed Consent given by: patient Patient understanding: patient states understanding of the procedure being performed Body area: head/neck Location details: forehead Wound Appearance: clean Sutures Removed: 4 Post-removal: dressing applied and antibiotic ointment applied Facility: sutures placed in this facility Patient tolerance: Patient tolerated the procedure well with no immediate complications   (including critical care time) Labs Review Labs  Reviewed - No data to display  Imaging Review No results found.   EKG Interpretation None      MDM   Final diagnoses:  Visit for suture removal   Wound is well-healed without signs of infections. Sutures removed without difficulty, patient tolerated well.  Continue home wound care, recommended mederma to help reduce scarring.  Discussed plan with patient, he/she acknowledged understanding and agreed with plan of care.  Return precautions given for new or worsening symptoms.  Larene Pickett, PA-C 08/02/14 [place], MD 08/03/14 [phone]

## 2014-09-05 ENCOUNTER — Emergency Department (INDEPENDENT_AMBULATORY_CARE_PROVIDER_SITE_OTHER)
Admission: EM | Admit: 2014-09-05 | Discharge: 2014-09-05 | Disposition: A | Discharge status: Home / Self Care | Payer: 59 | Source: Home / Self Care | Attending: Family Medicine | Admitting: Family Medicine

## 2014-09-05 ENCOUNTER — Encounter (HOSPITAL_COMMUNITY): Payer: Self-pay | Admitting: Emergency Medicine

## 2014-09-05 DIAGNOSIS — J040 Acute laryngitis: Secondary | ICD-10-CM

## 2014-09-05 DIAGNOSIS — K219 Gastro-esophageal reflux disease without esophagitis: Secondary | ICD-10-CM

## 2014-09-05 DIAGNOSIS — B9789 Other viral agents as the cause of diseases classified elsewhere: Secondary | ICD-10-CM

## 2014-09-05 DIAGNOSIS — B001 Herpesviral vesicular dermatitis: Secondary | ICD-10-CM

## 2014-09-05 DIAGNOSIS — J069 Acute upper respiratory infection, unspecified: Secondary | ICD-10-CM

## 2014-09-05 DIAGNOSIS — IMO0001 Reserved for inherently not codable concepts without codable children: Secondary | ICD-10-CM

## 2014-09-05 MED ORDER — IPRATROPIUM BROMIDE 0.06 % NA SOLN: 2.0000 | Freq: Four times a day (QID) | NASAL | Status: DC

## 2014-09-05 MED ORDER — FLUTICASONE PROPIONATE 50 MCG/ACT NA SUSP: 2.0000 | Freq: Every day | NASAL | Status: DC

## 2014-09-05 MED ORDER — DEXAMETHASONE SODIUM PHOSPHATE 10 MG/ML IJ SOLN
10.0000 mg | Freq: Once | INTRAMUSCULAR | Status: AC
  Administered 2014-09-05: 10 mg via INTRAMUSCULAR

## 2014-09-05 MED ORDER — ACYCLOVIR 400 MG PO TABS: 400.0000 mg | ORAL_TABLET | Freq: Every day | ORAL | Status: DC

## 2014-09-05 MED ORDER — OMEPRAZOLE 40 MG PO CPDR: 40.0000 mg | DELAYED_RELEASE_CAPSULE | Freq: Every day | ORAL | Status: DC

## 2014-09-05 MED ORDER — DEXAMETHASONE SODIUM PHOSPHATE 10 MG/ML IJ SOLN
INTRAMUSCULAR | Status: AC
  Filled 2014-09-05: qty 1

## 2014-09-05 NOTE — ED Provider Notes (Signed)
CSN: [id number]     Arrival date & time 09/05/14  1104 History   None    Chief Complaint  Patient presents with  . Facial Pain  . Nasal Congestion  . Dental Pain  . Otalgia  . Abdominal Pain  . Cough   (Consider location/radiation/quality/duration/timing/severity/associated sxs/prior Treatment) HPI  Started w/ general stomach discomfort 4 days ago. "Irritated feeling." eating a more bland diet. Deneis nausea and vomiting  Coughing and nasal congestion started yesterday. Denies fevers. Associated w/ HA and ear fullness. Has not taken anything. gettign worse. Associated w/ cough and CP w/ cough. No sore throat.   Cold sores: outbreak on upper and lower lips started this week. Happens when gets stressed w/ colds.   Past Medical History  Diagnosis Date  . Hypertension   . Hyperlipidemia   . Obesity (BMI 30.0-34.9)    Past Surgical History  Procedure Laterality Date  . Colonoscopy  2006   Family History  Problem Relation Age of Onset  . Hypertension Mother   . Hypertension Sister   . Hypertension Maternal Aunt   . Heart disease Maternal Aunt   . Hypertension Maternal Uncle    History  Substance Use Topics  . Smoking status: Never Smoker   . Smokeless tobacco: Not on file  . Alcohol Use: No   OB History    No data available     Review of Systems Per HPI with all other pertinent systems negative.   Allergies  Penicillins  Home Medications   Prior to Admission medications   Medication Sig Start Date End Date Taking? Authorizing Provider  Ascorbic Acid (VITAMIN C PO) Take by mouth.   Yes Historical Provider, MD  aspirin 81 MG tablet Take 81 mg by mouth daily.   Yes Historical Provider, MD  BIOTIN PO Take 1 tablet by mouth daily.   Yes Historical Provider, MD  cholecalciferol (VITAMIN D) 1000 UNITS tablet Take 1,000 Units by mouth daily.   Yes Historical Provider, MD  hydrochlorothiazide (HYDRODIURIL) 12.5 MG tablet Take 1 tablet (12.5 mg total) by mouth daily.  01/13/12  Yes Myra Marijo Sanes, MD  lisinopril (PRINIVIL,ZESTRIL) 10 MG tablet Take 1 tablet (10 mg total) by mouth daily. 01/13/12  Yes Emily Filbert, MD  Multiple Vitamin (MULTIVITAMIN) capsule Take 1 capsule by mouth daily.   Yes Historical Provider, MD  acyclovir (ZOVIRAX) 400 MG tablet Take 1 tablet (400 mg total) by mouth 5 (five) times daily. 09/05/14   Waldemar Dickens, MD  fluticasone (FLONASE) 50 MCG/ACT nasal spray Place 2 sprays into both nostrils at bedtime. 09/05/14   Waldemar Dickens, MD  ipratropium (ATROVENT) 0.06 % nasal spray Place 2 sprays into both nostrils 4 (four) times daily. 09/05/14   Waldemar Dickens, MD  omeprazole (PRILOSEC) 40 MG capsule Take 1 capsule (40 mg total) by mouth daily. Take for 14 days then as needed. 09/05/14   Waldemar Dickens, MD   BP 148/89 mmHg  Pulse 82  Temp(Src) 99.3 F (37.4 C) (Oral)  Resp 16  SpO2 98% Physical Exam  Constitutional: She is oriented to person, place, and time. She appears well-developed and well-nourished. No distress.  HENT:  Upper and lower lips w/ patches of erythematous ulcerated lesions R and L TMs w/ clear effusions  Eyes: EOM are normal. Pupils are equal, round, and reactive to light.  Neck: Normal range of motion.  Cardiovascular: Normal rate and intact distal pulses.   Pulmonary/Chest: Effort normal and breath sounds normal.  No respiratory distress. She has no wheezes. She has no rales.  Abdominal: Soft. Bowel sounds are normal. She exhibits no distension. There is no tenderness.  Musculoskeletal: Normal range of motion. She exhibits no tenderness.  Neurological: She is alert and oriented to person, place, and time.  Skin: Skin is warm and dry. She is not diaphoretic.  Psychiatric: She has a normal mood and affect. Her behavior is normal. Judgment and thought content normal.    ED Course  Procedures (including critical care time) Labs Review Labs Reviewed - No data to display  Imaging Review No results found.   MDM   1.  Recurrent cold sores   2. Viral URI with cough   3. Reflux   4. Laryngitis    Acyclovir Nasal atrovent, flonase, otc allergy pill Start protonix  Decadron 10mg  IOM in office.  Precautions given and all questions answered  Linna Darner, MD Family Medicine 09/05/2014, 12:32 PM    Waldemar Dickens, MD 09/05/14 [phone]

## 2014-09-05 NOTE — Discharge Instructions (Signed)
You have a cold sore outbreak Please start your acyclovir Please start the flonase and nasal atrovent Please start the prilosec for the upset stomach as you liekly have some increased acid production causing your symptoms

## 2014-09-05 NOTE — ED Notes (Signed)
Pt states she has been suffering from cold symptoms, to include sinus pain, ear ache, tooth ache, cough, congestion, chest pain from coughing, and a stomach ache since Monday.  She has not tried any remedies at home.

## 2014-11-20 ENCOUNTER — Encounter: Payer: Self-pay | Admitting: Internal Medicine

## 2015-04-02 DIAGNOSIS — E782 Mixed hyperlipidemia: Secondary | ICD-10-CM | POA: Insufficient documentation

## 2015-04-02 DIAGNOSIS — N812 Incomplete uterovaginal prolapse: Secondary | ICD-10-CM | POA: Insufficient documentation

## 2015-04-02 DIAGNOSIS — K279 Peptic ulcer, site unspecified, unspecified as acute or chronic, without hemorrhage or perforation: Secondary | ICD-10-CM | POA: Insufficient documentation

## 2015-08-31 HISTORY — PX: BLADDER SUSPENSION: SHX72

## 2015-08-31 HISTORY — PX: ABDOMINAL HYSTERECTOMY: SHX81

## 2015-10-27 ENCOUNTER — Emergency Department (INDEPENDENT_AMBULATORY_CARE_PROVIDER_SITE_OTHER)
Admission: EM | Admit: 2015-10-27 | Discharge: 2015-10-27 | Disposition: A | Discharge status: Home / Self Care | Payer: Self-pay | Source: Home / Self Care | Attending: Family Medicine | Admitting: Family Medicine

## 2015-10-27 DIAGNOSIS — B373 Candidiasis of vulva and vagina: Secondary | ICD-10-CM

## 2015-10-27 DIAGNOSIS — B3731 Acute candidiasis of vulva and vagina: Secondary | ICD-10-CM

## 2015-10-27 MED ORDER — FLUCONAZOLE 200 MG PO TABS: ORAL_TABLET | ORAL | Status: DC

## 2015-10-27 NOTE — Discharge Instructions (Signed)
Probiotics WHAT ARE PROBIOTICS? Probiotics are the good bacteria and yeasts that live in your body and keep you and your digestive system healthy. Probiotics also help your body's defense (immune) system and protect your body against bad bacterial growth.  Certain foods contain probiotics, such as yogurt. Probiotics can also be purchased as a supplement. As with any supplement or drug, it is important to discuss its use with your health care provider.  WHAT AFFECTS THE BALANCE OF BACTERIA IN MY BODY? The balance of bacteria in your body can be affected by:   Antibiotic medicines. Antibiotics are sometimes necessary to treat infection. Unfortunately, they may kill good or friendly bacteria in your body as well as the bad bacteria. This may lead to stomach problems like diarrhea, gas, and cramping.  Disease. Some conditions are the result of an overgrowth of bad bacteria, yeasts, parasites, or fungi. These conditions include:   Infectious diarrhea.  Stomach and respiratory infections.  Skin infections.  Irritable bowel syndrome (IBS).  Inflammatory bowel diseases.  Ulcer due to Helicobacter pylori (H. pylori) infection.  Tooth decay and periodontal disease.  Vaginal infections. Stress and poor diet may also lower the good bacteria in your body.  WHAT TYPE OF PROBIOTIC IS RIGHT FOR ME? Probiotics are available over the counter at your local pharmacy, health food, or grocery store. They come in many different forms, combinations of strains, and dosing strengths. Some may need to be refrigerated. Always read the label for storage and usage instructions. Specific strains have been shown to be more effective for certain conditions. Ask your health care provider what option is best for you.  WHY WOULD I NEED PROBIOTICS? There are many reasons your health care provider might recommend a probiotic supplement, including:   Diarrhea.  Constipation.  IBS.  Respiratory infections.  Yeast  infections.  Acne, eczema, and other skin conditions.  Frequent urinary tract infections (UTIs). ARE THERE SIDE EFFECTS OF PROBIOTICS? Some people experience mild side effects when taking probiotics. Side effects are usually temporary and may include:   Gas.  Bloating.  Cramping. Rarely, serious side effects, such as infection or immune system changes, may occur. WHAT ELSE DO I NEED TO KNOW ABOUT PROBIOTICS?   There are many different strains of probiotics. Certain strains may be more effective depending on your condition. Probiotics are available in varying doses. Ask your health care provider which probiotic you should use and how often.   If you are taking probiotics along with antibiotics, it is generally recommended to wait at least 2 hours between taking the antibiotic and taking the probiotic.  FOR MORE INFORMATION:  [facility] [email or web address]   This information is not intended to replace advice given to you by your health care provider. Make sure you discuss any questions you have with your health care provider.   Document Released: 03/13/2014 Document Reviewed: 03/13/2014 Elsevier Interactive Patient Education 2016 Elsevier Inc.  Monilial Vaginitis Vaginitis in a soreness, swelling and redness (inflammation) of the vagina and vulva. Monilial vaginitis is not a sexually transmitted infection. CAUSES  Yeast vaginitis is caused by yeast (candida) that is normally found in your vagina. With a yeast infection, the candida has overgrown in number to a point that upsets the chemical balance. SYMPTOMS   White, thick vaginal discharge.  Swelling, itching, redness and irritation of the vagina and possibly the lips of the vagina (vulva).  Burning or painful urination.  Painful intercourse. DIAGNOSIS  Things that may contribute  to monilial vaginitis are:  Postmenopausal and virginal  states.  Pregnancy.  Infections.  Being tired, sick or stressed, especially if you had monilial vaginitis in the past.  Diabetes. Good control will help lower the chance.  Birth control pills.  Tight fitting garments.  Using bubble bath, feminine sprays, douches or deodorant tampons.  Taking certain medications that kill germs (antibiotics).  Sporadic recurrence can occur if you become ill. TREATMENT  Your caregiver will give you medication.  There are several kinds of anti monilial vaginal creams and suppositories specific for monilial vaginitis. For recurrent yeast infections, use a suppository or cream in the vagina 2 times a week, or as directed.  Anti-monilial or steroid cream for the itching or irritation of the vulva may also be used. Get your caregiver's permission.  Painting the vagina with methylene blue solution may help if the monilial cream does not work.  Eating yogurt may help prevent monilial vaginitis. HOME CARE INSTRUCTIONS   Finish all medication as prescribed.  Do not have sex until treatment is completed or after your caregiver tells you it is okay.  Take warm sitz baths.  Do not douche.  Do not use tampons, especially scented ones.  Wear cotton underwear.  Avoid tight pants and panty hose.  Tell your sexual partner that you have a yeast infection. They should go to their caregiver if they have symptoms such as mild rash or itching.  Your sexual partner should be treated as well if your infection is difficult to eliminate.  Practice safer sex. Use condoms.  Some vaginal medications cause latex condoms to fail. Vaginal medications that harm condoms are:  Cleocin cream.  Butoconazole (Femstat).  Terconazole (Terazol) vaginal suppository.  Miconazole (Monistat) (may be purchased over the counter). SEEK MEDICAL CARE IF:   You have a temperature by mouth above 102 F (38.9 C).  The infection is getting worse after 2 days of  treatment.  The infection is not getting better after 3 days of treatment.  You develop blisters in or around your vagina.  You develop vaginal bleeding, and it is not your menstrual period.  You have pain when you urinate.  You develop intestinal problems.  You have pain with sexual intercourse.   This information is not intended to replace advice given to you by your health care provider. Make sure you discuss any questions you have with your health care provider.   Document Released: 05/26/2005 Document Revised: 11/08/2011 Document Reviewed: 02/17/2015 Elsevier Interactive Patient Education [employer].

## 2015-10-27 NOTE — ED Provider Notes (Signed)
CSN: [id number]     Arrival date & time 10/27/15  1939 History   First MD Initiated Contact with Patient 10/27/15 2024     Chief Complaint  Patient presents with  . Vaginitis   (Consider location/radiation/quality/duration/timing/severity/associated sxs/prior Treatment) HPI Patient states that she has had a thick white curdish discharge for about 2 weeks now. She states that she has had several yeast infections in her vagina in the past and this is typical for her infections. Patient states she has a relapse uterus and she has a pessary. She denies any other symptoms no pain. No fever. Patient states that she is unable to see her primary care provider because he had that in [place] and her insurance is changed and she now has to pay full price for evaluation at the clinic. Past Medical History  Diagnosis Date  . Hypertension   . Hyperlipidemia   . Obesity (BMI 30.0-34.9)    Past Surgical History  Procedure Laterality Date  . Colonoscopy  2006   Family History  Problem Relation Age of Onset  . Hypertension Mother   . Hypertension Sister   . Hypertension Maternal Aunt   . Heart disease Maternal Aunt   . Hypertension Maternal Uncle    Social History  Substance Use Topics  . Smoking status: Never Smoker   . Smokeless tobacco: Not on file  . Alcohol Use: No   OB History    No data available     Review of Systems Vaginal discharge Allergies  Penicillins  Home Medications   Prior to Admission medications   Medication Sig Start Date End Date Taking? Authorizing Provider  acyclovir (ZOVIRAX) 400 MG tablet Take 1 tablet (400 mg total) by mouth 5 (five) times daily. 09/05/14   Waldemar Dickens, MD  Ascorbic Acid (VITAMIN C PO) Take by mouth.    Historical Provider, MD  aspirin 81 MG tablet Take 81 mg by mouth daily.    Historical Provider, MD  BIOTIN PO Take 1 tablet by mouth daily.    Historical Provider, MD  cholecalciferol (VITAMIN D) 1000 UNITS tablet Take 1,000 Units by  mouth daily.    Historical Provider, MD  fluconazole (DIFLUCAN) 200 MG tablet 1 pill daily for 3 days 10/27/15   Konrad Felix, PA  fluticasone [facility]) 50 MCG/ACT nasal spray Place 2 sprays into both nostrils at bedtime. 09/05/14   Waldemar Dickens, MD  hydrochlorothiazide (HYDRODIURIL) 12.5 MG tablet Take 1 tablet (12.5 mg total) by mouth daily. 01/13/12   Emily Filbert, MD  ipratropium (ATROVENT) 0.06 % nasal spray Place 2 sprays into both nostrils 4 (four) times daily. 09/05/14   Waldemar Dickens, MD  lisinopril (PRINIVIL,ZESTRIL) 10 MG tablet Take 1 tablet (10 mg total) by mouth daily. 01/13/12   Emily Filbert, MD  Multiple Vitamin (MULTIVITAMIN) capsule Take 1 capsule by mouth daily.    Historical Provider, MD  omeprazole (PRILOSEC) 40 MG capsule Take 1 capsule (40 mg total) by mouth daily. Take for 14 days then as needed. 09/05/14   Waldemar Dickens, MD   Meds Ordered and Administered this Visit  Medications - No data to display  BP 165/82 mmHg  Pulse 76  Temp(Src) 98.3 F (36.8 C) (Oral)  Resp 16  SpO2 100% No data found.   Physical Exam  Constitutional: She is oriented to person, place, and time. She appears well-developed and well-nourished. No distress.  HENT:  Head: Normocephalic and atraumatic.  Pulmonary/Chest: Effort normal and  breath sounds normal. She exhibits no tenderness.  Abdominal: Soft.  Genitourinary:  Patient declines pelvic exam at this time.  Musculoskeletal: Normal range of motion.  Neurological: She is alert and oriented to person, place, and time.  Skin: Skin is warm and dry.  Psychiatric: She has a normal mood and affect. Her behavior is normal.    ED Course  Procedures (including critical care time)  Labs Review Labs Reviewed - No data to display  Imaging Review No results found.   Visual Acuity Review  Right Eye Distance:   Left Eye Distance:   Bilateral Distance:    Right Eye Near:   Left Eye Near:    Bilateral Near:        Diflucan is  provided to patient based on her history. MDM   1. Vaginal candidiasis   Patient is reassured that there are no issues that require transfer to higher level of care at this time.  Patient is advised to continue home symptomatic treatment. Prescription is sent to  pharmacy patient has indicated.  Patient is advised that if there are new or worsening symptoms or attend the emergency department, or contact primary care provider. Instructions of care provided discharged home in stable condition. Return to work/school note provided.  THIS NOTE WAS GENERATED USING A VOICE RECOGNITION SOFTWARE PROGRAM. ALL REASONABLE EFFORTS  WERE MADE TO PROOFREAD THIS DOCUMENT FOR ACCURACY.     Konrad Felix, [place] 10/27/15 2055

## 2015-10-27 NOTE — ED Notes (Signed)
Pt is being triaged by Linde Gillis, PA

## 2016-01-20 DIAGNOSIS — N8110 Cystocele, unspecified: Secondary | ICD-10-CM | POA: Insufficient documentation

## 2017-01-11 DIAGNOSIS — Z780 Asymptomatic menopausal state: Secondary | ICD-10-CM | POA: Diagnosis not present

## 2017-01-11 DIAGNOSIS — Z1231 Encounter for screening mammogram for malignant neoplasm of breast: Secondary | ICD-10-CM | POA: Diagnosis not present

## 2017-01-11 DIAGNOSIS — Z1211 Encounter for screening for malignant neoplasm of colon: Secondary | ICD-10-CM | POA: Diagnosis not present

## 2017-01-11 DIAGNOSIS — I10 Essential (primary) hypertension: Secondary | ICD-10-CM | POA: Diagnosis not present

## 2017-01-22 DIAGNOSIS — K047 Periapical abscess without sinus: Secondary | ICD-10-CM | POA: Diagnosis not present

## 2017-01-22 DIAGNOSIS — L03211 Cellulitis of face: Secondary | ICD-10-CM | POA: Diagnosis not present

## 2017-01-28 DIAGNOSIS — M810 Age-related osteoporosis without current pathological fracture: Secondary | ICD-10-CM | POA: Insufficient documentation

## 2017-02-17 DIAGNOSIS — Z1329 Encounter for screening for other suspected endocrine disorder: Secondary | ICD-10-CM | POA: Diagnosis not present

## 2017-02-17 DIAGNOSIS — R52 Pain, unspecified: Secondary | ICD-10-CM | POA: Diagnosis not present

## 2017-02-17 DIAGNOSIS — Z13228 Encounter for screening for other metabolic disorders: Secondary | ICD-10-CM | POA: Diagnosis not present

## 2017-02-17 DIAGNOSIS — R5382 Chronic fatigue, unspecified: Secondary | ICD-10-CM | POA: Diagnosis not present

## 2017-02-17 DIAGNOSIS — Z130 Encounter for screening for diseases of the blood and blood-forming organs and certain disorders involving the immune mechanism: Secondary | ICD-10-CM | POA: Diagnosis not present

## 2017-02-17 DIAGNOSIS — M791 Myalgia: Secondary | ICD-10-CM | POA: Diagnosis not present

## 2017-02-17 DIAGNOSIS — M21612 Bunion of left foot: Secondary | ICD-10-CM | POA: Diagnosis not present

## 2017-02-17 DIAGNOSIS — I10 Essential (primary) hypertension: Secondary | ICD-10-CM | POA: Diagnosis not present

## 2017-02-17 DIAGNOSIS — Z1321 Encounter for screening for nutritional disorder: Secondary | ICD-10-CM | POA: Diagnosis not present

## 2017-02-17 DIAGNOSIS — Z23 Encounter for immunization: Secondary | ICD-10-CM | POA: Diagnosis not present

## 2017-02-26 DIAGNOSIS — R197 Diarrhea, unspecified: Secondary | ICD-10-CM | POA: Diagnosis not present

## 2017-02-26 DIAGNOSIS — Z0389 Encounter for observation for other suspected diseases and conditions ruled out: Secondary | ICD-10-CM | POA: Diagnosis not present

## 2017-02-26 DIAGNOSIS — I10 Essential (primary) hypertension: Secondary | ICD-10-CM | POA: Diagnosis not present

## 2017-02-26 DIAGNOSIS — R109 Unspecified abdominal pain: Secondary | ICD-10-CM | POA: Diagnosis not present

## 2017-03-03 ENCOUNTER — Encounter (HOSPITAL_COMMUNITY): Payer: Self-pay | Admitting: Emergency Medicine

## 2017-03-03 ENCOUNTER — Ambulatory Visit (HOSPITAL_COMMUNITY)
Admission: EM | Admit: 2017-03-03 | Discharge: 2017-03-03 | Disposition: A | Discharge status: Home / Self Care | Payer: Medicare Other | Attending: Family Medicine | Admitting: Family Medicine

## 2017-03-03 DIAGNOSIS — Z880 Allergy status to penicillin: Secondary | ICD-10-CM | POA: Diagnosis not present

## 2017-03-03 DIAGNOSIS — R195 Other fecal abnormalities: Secondary | ICD-10-CM

## 2017-03-03 DIAGNOSIS — R197 Diarrhea, unspecified: Secondary | ICD-10-CM | POA: Insufficient documentation

## 2017-03-03 DIAGNOSIS — M940 Chondrocostal junction syndrome [Tietze]: Secondary | ICD-10-CM | POA: Diagnosis not present

## 2017-03-03 MED ORDER — NAPROXEN 375 MG PO TABS: 375.0000 mg | ORAL_TABLET | Freq: Two times a day (BID) | ORAL | 0 refills | Status: DC

## 2017-03-03 NOTE — Discharge Instructions (Signed)
Today you were diagnosed with the following: 1. Costochondritis   2. Loose stools    You have been prescribed prescription medications this visit.   Meds ordered this encounter  Medications   naproxen (NAPROSYN) 375 MG tablet    Sig: Take 1 tablet (375 mg total) by mouth 2 (two) times daily with a meal.    Dispense:  14 tablet    Refill:  0   This medicine should help with your chest wall discomfort.  We have sent stool studies to try and determine why you are having persistent loose stools. We will contact you once the results are received. Please continue to ensure adequate fluid intake to avoid dehydration.  If you are not improving over the next few days or feel you are worsening please follow up here or the Emergency Department if you are unable to see your regular doctor.

## 2017-03-03 NOTE — ED Triage Notes (Signed)
Pt here for left sided CP onset yest ... Noticed it after cleaning house.... Pain increases w/activity  sts it feels like a pulled muscle  Also c/o diarrhea onset 3 weeks but it got worse yest.   Denies n/v, fevers  A&O x4... NAD... Ambulatory

## 2017-03-03 NOTE — ED Provider Notes (Signed)
  [place]   [id number] 03/03/17 Arrival Time: 7824  ASSESSMENT & PLAN:  Today you were diagnosed with the following: 1. Costochondritis   2. Loose stools    You have been prescribed prescription medications this visit.   Meds ordered this encounter  Medications  . naproxen (NAPROSYN) 375 MG tablet    Sig: Take 1 tablet (375 mg total) by mouth 2 (two) times daily with a meal.    Dispense:  14 tablet    Refill:  0   This medicine should help with your chest wall discomfort.  We have sent stool studies to try and determine why you are having persistent loose stools. We will contact you once the results are received. Please continue to ensure adequate fluid intake to avoid dehydration.  If you are not improving over the next few days or feel you are worsening please follow up here or the Emergency Department if you are unable to see your regular doctor.  Reviewed expectations re: course of current medical issues. Questions answered. Outlined signs and symptoms indicating need for more acute intervention. Patient verbalized understanding. After Visit Summary given.   SUBJECTIVE:  [name] is a 66 y.o. female who presents with complaint of chest wall discomfort. Started yesterday. Questions if she pulled muscle while cleaning her house yesterday. Continued soreness today, esp with certain movements. No SOB/n/v. No self treatment.  Also reports frequent loose stools over the past 3 weeks, maybe more. Recently seen at another urgent care. Reports abdominal xrays were taken and that she was told they were normal. No abdominal pain except for occasional cramping before having a bowel movement. No blood in stool. No recent travel. No n/v. Tolerating normal PO intake. No new medications.  ROS: As per HPI.   OBJECTIVE:  Vitals:   03/03/17 1643  BP: (!) 142/88  Pulse: 91  Resp: 16  Temp: 99.9 F (37.7 C)  TempSrc: Oral  SpO2: 98%    General appearance: alert;  no distress Head: normocephalic; atraumatic Eyes: conjunctivae normal; EOMI Ears: normal TM's and external ear canals Nose: normal mucosa; no drainage Throat: lips, mucosa, and tongue normal; teeth and gums normal Neck: supple; symmetrical; trachea midline Chest wall: anterior tenderness with palpation; L>R (reproduces pain reported) Lungs: clear to auscultation bilaterally Heart: regular rate and rhythm Abdomen: soft, non-tender; bowel sounds normal; no masses or organomegaly; no guarding or rebound tenderness Extremities: extremities normal, atraumatic, no cyanosis or edema Skin: warm and dry   Labs Reviewed  GASTROINTESTINAL PANEL BY PCR, STOOL (REPLACES STOOL CULTURE)  Pending   Allergies  Allergen Reactions  . Penicillins     Rash     PMHx, SurgHx, SocialHx, Medications, and Allergies were reviewed in the Visit Navigator and updated as appropriate.      Vanessa Kick, MD 03/03/17 [phone]

## 2017-03-04 ENCOUNTER — Encounter (HOSPITAL_COMMUNITY): Payer: Self-pay | Admitting: Family Medicine

## 2017-03-04 ENCOUNTER — Ambulatory Visit (HOSPITAL_COMMUNITY)
Admission: EM | Admit: 2017-03-04 | Discharge: 2017-03-04 | Disposition: A | Discharge status: Home / Self Care | Payer: Medicare Other | Attending: Internal Medicine | Admitting: Internal Medicine

## 2017-03-04 DIAGNOSIS — R09A2 Foreign body sensation, throat: Secondary | ICD-10-CM

## 2017-03-04 DIAGNOSIS — F458 Other somatoform disorders: Secondary | ICD-10-CM

## 2017-03-04 DIAGNOSIS — R0989 Other specified symptoms and signs involving the circulatory and respiratory systems: Secondary | ICD-10-CM

## 2017-03-04 DIAGNOSIS — R198 Other specified symptoms and signs involving the digestive system and abdomen: Secondary | ICD-10-CM

## 2017-03-04 LAB — GASTROINTESTINAL PANEL BY PCR, STOOL (REPLACES STOOL CULTURE)

## 2017-03-04 NOTE — ED Provider Notes (Signed)
CSN: [id number]     Arrival date & time 03/04/17  1157 History   None    Chief Complaint  Patient presents with  . Dysphagia   (Consider location/radiation/quality/duration/timing/severity/associated sxs/prior Treatment) Patient c/o trouble swallowing pill and states she feels swelling in the back of her throat after swallowing pill and thinks that the pill may be in the back of her throat.   The history is provided by the patient.  Sore Throat  This is a new problem. The current episode started less than 1 hour ago. The problem occurs constantly. The problem has not changed since onset.Nothing aggravates the symptoms. Nothing relieves the symptoms. She has tried nothing for the symptoms.    Past Medical History:  Diagnosis Date  . Hyperlipidemia   . Hypertension   . Obesity (BMI 30.0-34.9)    Past Surgical History:  Procedure Laterality Date  . COLONOSCOPY  2006   Family History  Problem Relation Age of Onset  . Hypertension Mother   . Hypertension Sister   . Hypertension Maternal Aunt   . Heart disease Maternal Aunt   . Hypertension Maternal Uncle    Social History  Substance Use Topics  . Smoking status: Never Smoker  . Smokeless tobacco: Never Used  . Alcohol use No   OB History    No data available     Review of Systems  Constitutional: Negative.   HENT: Positive for sore throat.   Eyes: Negative.   Respiratory: Negative.   Gastrointestinal: Negative.   Endocrine: Negative.   Genitourinary: Negative.   Musculoskeletal: Negative.   Allergic/Immunologic: Negative.   Neurological: Negative.   Hematological: Negative.   Psychiatric/Behavioral: Negative.     Allergies  Penicillins  Home Medications   Prior to Admission medications   Medication Sig Start Date End Date Taking? Authorizing Provider  acyclovir (ZOVIRAX) 400 MG tablet Take 1 tablet (400 mg total) by mouth 5 (five) times daily. 09/05/14   Kelly Dickens, MD  Ascorbic Acid (VITAMIN C PO)  Take by mouth.    [provider]  aspirin 81 MG tablet Take 81 mg by mouth daily.    [provider]  BIOTIN PO Take 1 tablet by mouth daily.    [provider]  cholecalciferol (VITAMIN D) 1000 UNITS tablet Take 1,000 Units by mouth daily.    [provider]  fluconazole (DIFLUCAN) 200 MG tablet 1 pill daily for 3 days 10/27/15   Kelly Felix, PA  fluticasone [facility]) 50 MCG/ACT nasal spray Place 2 sprays into both nostrils at bedtime. 09/05/14   Kelly Dickens, MD  hydrochlorothiazide (HYDRODIURIL) 12.5 MG tablet Take 1 tablet (12.5 mg total) by mouth daily. 01/13/12   Kelly Filbert, MD  ipratropium (ATROVENT) 0.06 % nasal spray Place 2 sprays into both nostrils 4 (four) times daily. 09/05/14   Kelly Dickens, MD  lisinopril (PRINIVIL,ZESTRIL) 10 MG tablet Take 1 tablet (10 mg total) by mouth daily. 01/13/12   Kelly Filbert, MD  Multiple Vitamin (MULTIVITAMIN) capsule Take 1 capsule by mouth daily.    [provider]  naproxen (NAPROSYN) 375 MG tablet Take 1 tablet (375 mg total) by mouth 2 (two) times daily with a meal. 03/03/17   Kelly Kick, MD  omeprazole (PRILOSEC) 40 MG capsule Take 1 capsule (40 mg total) by mouth daily. Take for 14 days then as needed. 09/05/14   Kelly Dickens, MD   Meds Ordered and Administered this Visit  Medications - No  data to display  BP 126/76 (BP Location: Left Arm)   Pulse 80   Temp 98 F (36.7 C) (Oral)   Resp 16   SpO2 97%  No data found.   Physical Exam  Constitutional: She is oriented to person, place, and time. She appears well-developed and well-nourished.  HENT:  Head: Normocephalic and atraumatic.  Right Ear: External ear normal.  Left Ear: External ear normal.  Mouth/Throat: Oropharynx is clear and moist.  Eyes: Conjunctivae and EOM are normal. Pupils are equal, round, and reactive to light.  Neck: Normal range of motion. Neck supple.  Cardiovascular: Normal rate, regular rhythm and normal  heart sounds.   Pulmonary/Chest: Effort normal.  Neurological: She is alert and oriented to person, place, and time.  Nursing note and vitals reviewed.   Urgent Care Course     Procedures (including critical care time)  Labs Review Labs Reviewed - No data to display  Imaging Review No results found.   Visual Acuity Review  Right Eye Distance:   Left Eye Distance:   Bilateral Distance:    Right Eye Near:   Left Eye Near:    Bilateral Near:         MDM   1. Globus syndrome    Reassurance given that no pill is seen at back of throat.  Warm Salt Water Gargles PRN        [name], [place] 03/04/17 1103

## 2017-03-04 NOTE — ED Triage Notes (Signed)
Pt here for trouble swallowing after taking a pill. sts the pill is stuck in her throat.

## 2017-03-04 NOTE — Discharge Instructions (Signed)
Recommend warm salt water gargles and reassurance no pill seen at back of throat.

## 2017-03-06 ENCOUNTER — Telehealth (HOSPITAL_COMMUNITY): Payer: Self-pay | Admitting: Emergency Medicine

## 2017-03-06 NOTE — Telephone Encounter (Signed)
Patient reports continued diarrhea and thinks she is getting dehydrated.  Encouraged patient to call pcp and /or go to ed for further testing.  Stool sample negative.  Patient agreed to recommendation.

## 2017-03-07 DIAGNOSIS — I10 Essential (primary) hypertension: Secondary | ICD-10-CM | POA: Diagnosis not present

## 2017-03-07 DIAGNOSIS — R197 Diarrhea, unspecified: Secondary | ICD-10-CM | POA: Diagnosis not present

## 2017-03-07 DIAGNOSIS — Z1212 Encounter for screening for malignant neoplasm of rectum: Secondary | ICD-10-CM | POA: Diagnosis not present

## 2017-03-07 DIAGNOSIS — Z1211 Encounter for screening for malignant neoplasm of colon: Secondary | ICD-10-CM | POA: Diagnosis not present

## 2017-03-09 ENCOUNTER — Encounter (HOSPITAL_COMMUNITY): Payer: Self-pay | Admitting: Emergency Medicine

## 2017-03-09 DIAGNOSIS — R6520 Severe sepsis without septic shock: Secondary | ICD-10-CM | POA: Diagnosis present

## 2017-03-09 DIAGNOSIS — N179 Acute kidney failure, unspecified: Secondary | ICD-10-CM | POA: Diagnosis present

## 2017-03-09 DIAGNOSIS — Z8249 Family history of ischemic heart disease and other diseases of the circulatory system: Secondary | ICD-10-CM

## 2017-03-09 DIAGNOSIS — E785 Hyperlipidemia, unspecified: Secondary | ICD-10-CM | POA: Diagnosis present

## 2017-03-09 DIAGNOSIS — A414 Sepsis due to anaerobes: Secondary | ICD-10-CM | POA: Diagnosis not present

## 2017-03-09 DIAGNOSIS — E871 Hypo-osmolality and hyponatremia: Secondary | ICD-10-CM | POA: Diagnosis present

## 2017-03-09 DIAGNOSIS — I10 Essential (primary) hypertension: Secondary | ICD-10-CM | POA: Diagnosis present

## 2017-03-09 DIAGNOSIS — Z79899 Other long term (current) drug therapy: Secondary | ICD-10-CM

## 2017-03-09 DIAGNOSIS — E860 Dehydration: Secondary | ICD-10-CM | POA: Diagnosis not present

## 2017-03-09 DIAGNOSIS — E876 Hypokalemia: Secondary | ICD-10-CM | POA: Diagnosis not present

## 2017-03-09 DIAGNOSIS — A0472 Enterocolitis due to Clostridium difficile, not specified as recurrent: Secondary | ICD-10-CM | POA: Diagnosis not present

## 2017-03-09 DIAGNOSIS — E278 Other specified disorders of adrenal gland: Secondary | ICD-10-CM | POA: Diagnosis present

## 2017-03-09 DIAGNOSIS — Z880 Allergy status to penicillin: Secondary | ICD-10-CM

## 2017-03-09 DIAGNOSIS — Z7982 Long term (current) use of aspirin: Secondary | ICD-10-CM

## 2017-03-09 LAB — COMPREHENSIVE METABOLIC PANEL
ALT: 30 U/L (ref 14–54)
AST: 53 U/L — ABNORMAL HIGH (ref 15–41)
Albumin: 2.5 g/dL — ABNORMAL LOW (ref 3.5–5.0)
Alkaline Phosphatase: 125 U/L (ref 38–126)
Anion gap: 12 (ref 5–15)
BUN: 14 mg/dL (ref 6–20)
CO2: 23 mmol/L (ref 22–32)
Calcium: 7.7 mg/dL — ABNORMAL LOW (ref 8.9–10.3)
Chloride: 98 mmol/L — ABNORMAL LOW (ref 101–111)
Creatinine, Ser: 1.09 mg/dL — ABNORMAL HIGH (ref 0.44–1.00)
GFR calc Af Amer: >60 mL/min (ref >60)
GFR calc non Af Amer: 52 mL/min — ABNORMAL LOW (ref >60)
Glucose, Bld: 152 mg/dL — ABNORMAL HIGH (ref 65–99)
Potassium: 2.9 mmol/L — ABNORMAL LOW (ref 3.5–5.1)
Sodium: 133 mmol/L — ABNORMAL LOW (ref 135–145)
Total Bilirubin: 0.6 mg/dL (ref 0.3–1.2)
Total Protein: 7.9 g/dL (ref 6.5–8.1)

## 2017-03-09 LAB — I-STAT TROPONIN, ED: Troponin i, poc: 0.05 ng/mL (ref 0.00–0.08)

## 2017-03-09 LAB — CBC
HCT: 35.4 % — ABNORMAL LOW (ref 36.0–46.0)
Hemoglobin: 12.1 g/dL (ref 12.0–15.0)
MCH: 28.7 pg (ref 26.0–34.0)
MCHC: 34.2 g/dL (ref 30.0–36.0)
MCV: 84.1 fL (ref 78.0–100.0)
Platelets: 375 10*3/uL (ref 150–400)
RBC: 4.21 MIL/uL (ref 3.87–5.11)
RDW: 13.9 % (ref 11.5–15.5)
WBC: 47.7 10*3/uL — ABNORMAL HIGH (ref 4.0–10.5)

## 2017-03-09 LAB — LIPASE, BLOOD: Lipase: 16 U/L (ref 11–51)

## 2017-03-09 NOTE — ED Triage Notes (Signed)
C/o diarrhea and L sided abd pain x 3 weeks.  Reports nausea and vomiting x 1 week.  States she has seen her PCP for same and doesn't feel any better.

## 2017-03-10 ENCOUNTER — Encounter (HOSPITAL_COMMUNITY): Payer: Self-pay | Admitting: General Practice

## 2017-03-10 ENCOUNTER — Emergency Department (HOSPITAL_COMMUNITY): Payer: Medicare Other

## 2017-03-10 ENCOUNTER — Inpatient Hospital Stay (HOSPITAL_COMMUNITY)
Admission: EM | Admit: 2017-03-10 | Discharge: 2017-03-13 | DRG: 872 | Disposition: A | Discharge status: Home / Self Care | Payer: Medicare Other | Attending: Internal Medicine | Admitting: Internal Medicine

## 2017-03-10 DIAGNOSIS — E860 Dehydration: Secondary | ICD-10-CM

## 2017-03-10 DIAGNOSIS — E278 Other specified disorders of adrenal gland: Secondary | ICD-10-CM | POA: Diagnosis present

## 2017-03-10 DIAGNOSIS — N179 Acute kidney failure, unspecified: Secondary | ICD-10-CM | POA: Diagnosis not present

## 2017-03-10 DIAGNOSIS — Z880 Allergy status to penicillin: Secondary | ICD-10-CM | POA: Diagnosis not present

## 2017-03-10 DIAGNOSIS — D729 Disorder of white blood cells, unspecified: Secondary | ICD-10-CM | POA: Diagnosis not present

## 2017-03-10 DIAGNOSIS — D72828 Other elevated white blood cell count: Secondary | ICD-10-CM

## 2017-03-10 DIAGNOSIS — E785 Hyperlipidemia, unspecified: Secondary | ICD-10-CM | POA: Diagnosis present

## 2017-03-10 DIAGNOSIS — D72825 Bandemia: Secondary | ICD-10-CM | POA: Diagnosis not present

## 2017-03-10 DIAGNOSIS — E871 Hypo-osmolality and hyponatremia: Secondary | ICD-10-CM | POA: Diagnosis present

## 2017-03-10 DIAGNOSIS — A414 Sepsis due to anaerobes: Secondary | ICD-10-CM | POA: Diagnosis present

## 2017-03-10 DIAGNOSIS — I10 Essential (primary) hypertension: Secondary | ICD-10-CM | POA: Diagnosis present

## 2017-03-10 DIAGNOSIS — A0472 Enterocolitis due to Clostridium difficile, not specified as recurrent: Secondary | ICD-10-CM | POA: Diagnosis not present

## 2017-03-10 DIAGNOSIS — R6520 Severe sepsis without septic shock: Secondary | ICD-10-CM | POA: Diagnosis present

## 2017-03-10 DIAGNOSIS — Z8249 Family history of ischemic heart disease and other diseases of the circulatory system: Secondary | ICD-10-CM | POA: Diagnosis not present

## 2017-03-10 DIAGNOSIS — D72829 Elevated white blood cell count, unspecified: Secondary | ICD-10-CM

## 2017-03-10 DIAGNOSIS — E876 Hypokalemia: Secondary | ICD-10-CM | POA: Diagnosis not present

## 2017-03-10 DIAGNOSIS — Z79899 Other long term (current) drug therapy: Secondary | ICD-10-CM | POA: Diagnosis not present

## 2017-03-10 DIAGNOSIS — A419 Sepsis, unspecified organism: Secondary | ICD-10-CM | POA: Diagnosis not present

## 2017-03-10 DIAGNOSIS — E279 Disorder of adrenal gland, unspecified: Secondary | ICD-10-CM | POA: Diagnosis not present

## 2017-03-10 DIAGNOSIS — Z7982 Long term (current) use of aspirin: Secondary | ICD-10-CM | POA: Diagnosis not present

## 2017-03-10 HISTORY — DX: Personal history of other diseases of the digestive system: Z87.19

## 2017-03-10 HISTORY — DX: Personal history of peptic ulcer disease: Z87.11

## 2017-03-10 LAB — URINALYSIS, ROUTINE W REFLEX MICROSCOPIC
Bilirubin Urine: NEGATIVE
Glucose, UA: 50 mg/dL — AB
Ketones, ur: 5 mg/dL — AB
Leukocytes, UA: NEGATIVE
Nitrite: NEGATIVE
Protein, ur: 100 mg/dL — AB
Specific Gravity, Urine: 1.042 — ABNORMAL HIGH (ref 1.005–1.030)
pH: 6 (ref 5.0–8.0)

## 2017-03-10 LAB — CBC WITH DIFFERENTIAL/PLATELET
Basophils Absolute: 0 10*3/uL (ref 0.0–0.1)
Basophils Relative: 0 %
Eosinophils Absolute: 0 10*3/uL (ref 0.0–0.7)
Eosinophils Relative: 0 %
HCT: 35.6 % — ABNORMAL LOW (ref 36.0–46.0)
Hemoglobin: 11.6 g/dL — ABNORMAL LOW (ref 12.0–15.0)
Lymphocytes Relative: 2 %
Lymphs Abs: 0.9 10*3/uL (ref 0.7–4.0)
MCH: 27.8 pg (ref 26.0–34.0)
MCHC: 32.6 g/dL (ref 30.0–36.0)
MCV: 85.2 fL (ref 78.0–100.0)
Monocytes Absolute: 2.3 10*3/uL — ABNORMAL HIGH (ref 0.1–1.0)
Monocytes Relative: 5 %
Neutro Abs: 42.2 10*3/uL — ABNORMAL HIGH (ref 1.7–7.7)
Neutrophils Relative %: 93 %
Platelets: 367 10*3/uL (ref 150–400)
RBC: 4.18 MIL/uL (ref 3.87–5.11)
RDW: 14 % (ref 11.5–15.5)
WBC: 45.4 10*3/uL — ABNORMAL HIGH (ref 4.0–10.5)

## 2017-03-10 LAB — BASIC METABOLIC PANEL
Anion gap: 10 (ref 5–15)
BUN: 12 mg/dL (ref 6–20)
CO2: 23 mmol/L (ref 22–32)
Calcium: 7.3 mg/dL — ABNORMAL LOW (ref 8.9–10.3)
Chloride: 103 mmol/L (ref 101–111)
Creatinine, Ser: 0.9 mg/dL (ref 0.44–1.00)
GFR calc Af Amer: >60 mL/min (ref >60)
GFR calc non Af Amer: >60 mL/min (ref >60)
Glucose, Bld: 129 mg/dL — ABNORMAL HIGH (ref 65–99)
Potassium: 3.7 mmol/L (ref 3.5–5.1)
Sodium: 136 mmol/L (ref 135–145)

## 2017-03-10 LAB — DIFFERENTIAL
Band Neutrophils: 0 %
Basophils Absolute: 0 10*3/uL (ref 0.0–0.1)
Basophils Relative: 0 %
Blasts: 0 %
Eosinophils Absolute: 0 10*3/uL (ref 0.0–0.7)
Eosinophils Relative: 0 %
Lymphocytes Relative: 2 %
Lymphs Abs: 1.4 10*3/uL (ref 0.7–4.0)
Metamyelocytes Relative: 0 %
Monocytes Absolute: 2.4 10*3/uL — ABNORMAL HIGH (ref 0.1–1.0)
Monocytes Relative: 5 %
Myelocytes: 0 %
Neutro Abs: 44.4 10*3/uL — ABNORMAL HIGH (ref 1.7–7.7)
Neutrophils Relative %: 93 %
Other: 0 %
Promyelocytes Absolute: 0 %
nRBC: 0 /100 WBC

## 2017-03-10 LAB — I-STAT BETA HCG BLOOD, ED (MC, WL, AP ONLY): I-stat hCG, quantitative: 7.7 m[IU]/mL — ABNORMAL HIGH (ref <5)

## 2017-03-10 LAB — C DIFFICILE QUICK SCREEN W PCR REFLEX
C Diff antigen: POSITIVE — AB
C Diff interpretation: DETECTED
C Diff toxin: POSITIVE — AB

## 2017-03-10 LAB — LACTIC ACID, PLASMA
Lactic Acid, Venous: 2.6 mmol/L (ref 0.5–1.9)
Lactic Acid, Venous: 1.3 mmol/L (ref 0.5–1.9)

## 2017-03-10 LAB — HIV ANTIBODY (ROUTINE TESTING W REFLEX): HIV Screen 4th Generation wRfx: NONREACTIVE

## 2017-03-10 LAB — MAGNESIUM: Magnesium: 2.1 mg/dL (ref 1.7–2.4)

## 2017-03-10 MED ORDER — VANCOMYCIN 50 MG/ML ORAL SOLUTION: 500.0000 mg | Freq: Four times a day (QID) | ORAL | Status: DC

## 2017-03-10 MED ORDER — ENSURE ENLIVE PO LIQD
237.0000 mL | Freq: Two times a day (BID) | ORAL | Status: DC
  Administered 2017-03-10: 237 mL via ORAL

## 2017-03-10 MED ORDER — IOPAMIDOL (ISOVUE-300) INJECTION 61%
INTRAVENOUS | Status: AC
  Administered 2017-03-10: 100 mL
  Filled 2017-03-10: qty 100

## 2017-03-10 MED ORDER — ONDANSETRON HCL 4 MG PO TABS: 4.0000 mg | ORAL_TABLET | Freq: Four times a day (QID) | ORAL | Status: DC | PRN

## 2017-03-10 MED ORDER — DICYCLOMINE HCL 10 MG PO CAPS
10.0000 mg | ORAL_CAPSULE | Freq: Three times a day (TID) | ORAL | Status: DC
  Administered 2017-03-10 – 2017-03-13 (×12): 10 mg via ORAL
  Filled 2017-03-10 (×13): qty 1

## 2017-03-10 MED ORDER — ACETAMINOPHEN 650 MG RE SUPP: 650.0000 mg | Freq: Four times a day (QID) | RECTAL | Status: DC | PRN

## 2017-03-10 MED ORDER — SODIUM CHLORIDE 0.9 % IV SOLN
INTRAVENOUS | Status: DC
  Administered 2017-03-10 – 2017-03-11 (×3): via INTRAVENOUS
  Administered 2017-03-11: 125 mL/h via INTRAVENOUS
  Administered 2017-03-11: 03:00:00 via INTRAVENOUS
  Administered 2017-03-12: 125 mL/h via INTRAVENOUS

## 2017-03-10 MED ORDER — SODIUM CHLORIDE 0.9 % IV BOLUS (SEPSIS)
1000.0000 mL | Freq: Once | INTRAVENOUS | Status: AC
  Administered 2017-03-10: 1000 mL via INTRAVENOUS

## 2017-03-10 MED ORDER — ENOXAPARIN SODIUM 40 MG/0.4ML ~~LOC~~ SOLN
40.0000 mg | SUBCUTANEOUS | Status: DC
  Administered 2017-03-10 – 2017-03-11 (×2): 40 mg via SUBCUTANEOUS
  Filled 2017-03-10 (×2): qty 0.4

## 2017-03-10 MED ORDER — POTASSIUM CHLORIDE 10 MEQ/100ML IV SOLN
10.0000 meq | INTRAVENOUS | Status: AC
  Administered 2017-03-10: 10 meq via INTRAVENOUS
  Filled 2017-03-10: qty 100

## 2017-03-10 MED ORDER — ACETAMINOPHEN 325 MG PO TABS
650.0000 mg | ORAL_TABLET | Freq: Four times a day (QID) | ORAL | Status: DC | PRN
  Administered 2017-03-10 – 2017-03-12 (×3): 650 mg via ORAL
  Filled 2017-03-10 (×3): qty 2

## 2017-03-10 MED ORDER — POTASSIUM CHLORIDE 10 MEQ/100ML IV SOLN
10.0000 meq | INTRAVENOUS | Status: AC
  Administered 2017-03-10 (×2): 10 meq via INTRAVENOUS
  Filled 2017-03-10: qty 100

## 2017-03-10 MED ORDER — VANCOMYCIN 50 MG/ML ORAL SOLUTION
500.0000 mg | Freq: Four times a day (QID) | ORAL | Status: DC
  Administered 2017-03-10 – 2017-03-13 (×14): 500 mg via ORAL
  Filled 2017-03-10 (×16): qty 10

## 2017-03-10 MED ORDER — POTASSIUM CHLORIDE CRYS ER 20 MEQ PO TBCR
40.0000 meq | EXTENDED_RELEASE_TABLET | ORAL | Status: AC
  Administered 2017-03-10 (×2): 40 meq via ORAL
  Filled 2017-03-10 (×2): qty 2

## 2017-03-10 MED ORDER — ADULT MULTIVITAMIN W/MINERALS CH
1.0000 | ORAL_TABLET | Freq: Every day | ORAL | Status: DC
  Administered 2017-03-10 – 2017-03-13 (×4): 1 via ORAL
  Filled 2017-03-10 (×4): qty 1

## 2017-03-10 MED ORDER — METRONIDAZOLE IN NACL 5-0.79 MG/ML-% IV SOLN
500.0000 mg | Freq: Three times a day (TID) | INTRAVENOUS | Status: DC
  Administered 2017-03-10 – 2017-03-13 (×11): 500 mg via INTRAVENOUS
  Filled 2017-03-10 (×11): qty 100

## 2017-03-10 MED ORDER — FENTANYL CITRATE (PF) 100 MCG/2ML IJ SOLN: 50.0000 ug | INTRAMUSCULAR | Status: DC | PRN

## 2017-03-10 MED ORDER — ASPIRIN EC 81 MG PO TBEC
81.0000 mg | DELAYED_RELEASE_TABLET | Freq: Every day | ORAL | Status: DC
  Administered 2017-03-10 – 2017-03-13 (×4): 81 mg via ORAL
  Filled 2017-03-10 (×4): qty 1

## 2017-03-10 MED ORDER — ENSURE ENLIVE PO LIQD
237.0000 mL | Freq: Three times a day (TID) | ORAL | Status: DC
  Administered 2017-03-10 – 2017-03-13 (×9): 237 mL via ORAL

## 2017-03-10 MED ORDER — ONDANSETRON HCL 4 MG/2ML IJ SOLN: 4.0000 mg | Freq: Four times a day (QID) | INTRAMUSCULAR | Status: DC | PRN

## 2017-03-10 MED ORDER — SODIUM CHLORIDE 0.9 % IV BOLUS (SEPSIS)
500.0000 mL | Freq: Once | INTRAVENOUS | Status: AC
  Administered 2017-03-10: 500 mL via INTRAVENOUS

## 2017-03-10 MED ORDER — VANCOMYCIN 50 MG/ML ORAL SOLUTION
125.0000 mg | Freq: Three times a day (TID) | ORAL | Status: DC
  Filled 2017-03-10 (×2): qty 2.5

## 2017-03-10 MED ORDER — SODIUM CHLORIDE 0.9 % IV BOLUS (SEPSIS)
500.0000 mL | INTRAVENOUS | Status: DC | PRN
  Administered 2017-03-10: 500 mL via INTRAVENOUS

## 2017-03-10 MED ORDER — FENTANYL CITRATE (PF) 100 MCG/2ML IJ SOLN
INTRAMUSCULAR | Status: AC
  Filled 2017-03-10: qty 2

## 2017-03-10 MED ORDER — SODIUM CHLORIDE 0.9 % IV BOLUS (SEPSIS): 1000.0000 mL | Freq: Once | INTRAVENOUS | Status: DC

## 2017-03-10 MED ORDER — POTASSIUM CHLORIDE 10 MEQ/100ML IV SOLN
10.0000 meq | Freq: Once | INTRAVENOUS | Status: AC
  Administered 2017-03-10: 10 meq via INTRAVENOUS
  Filled 2017-03-10: qty 100

## 2017-03-10 MED ORDER — MULTIVITAMINS PO CAPS: 1.0000 | ORAL_CAPSULE | Freq: Every day | ORAL | Status: DC

## 2017-03-10 MED ORDER — FENTANYL CITRATE (PF) 100 MCG/2ML IJ SOLN: 100.0000 ug | Freq: Once | INTRAMUSCULAR | Status: DC

## 2017-03-10 MED ORDER — ONDANSETRON HCL 4 MG/2ML IJ SOLN
4.0000 mg | Freq: Once | INTRAMUSCULAR | Status: DC
  Filled 2017-03-10: qty 2

## 2017-03-10 NOTE — ED Provider Notes (Signed)
[facility] DEPT Provider Note   CSN: [id number] Arrival date & time: 03/09/17  2228   By signing my name below, I, Eunice Blase, attest that this documentation has been prepared under the direction and in the presence of Thayer Jew, MD. Electronically signed, Eunice Blase, ED Scribe. 03/10/17. 2:54 AM.   History   Chief Complaint Chief Complaint  Patient presents with  . Abdominal Pain  . Diarrhea   The history is provided by the patient and medical records. No language interpreter was used.    Tyhesha Dutson is a 66 y.o. female with h/o HTN, HLD and colonoscopy presenting to the Emergency Department concerning persistent diarrhea x 3 weeks. Of note, nursing staff states pt reported abx use ~1 month ago for a dental infection. Associated sharp upper abdominal pain 7/10 in severity, N/V, and mucus stools. She states her associated symptoms all began ~1 week ago.  Pt states she was evaluated for this at that time, prescribed anti diarrhea medication and referred to GI specialist by her PCP. She states she has not seen the specialist d/t lack of insurance. She states the prescrbed medication has improved her diarrhea mildly. Partial hysterectomy noted 03/2016. No blood in stool, fever, chills or SOB. No other complaints at this time.   Past Medical History:  Diagnosis Date  . Hyperlipidemia   . Hypertension   . Obesity (BMI 30.0-34.9)     Patient Active Problem List   Diagnosis Date Noted  . Clostridium difficile colitis 03/10/2017  . Neutrophilic leukocytosis 76/28/3151  . HYPERTENSION 08/19/2006    Past Surgical History:  Procedure Laterality Date  . COLONOSCOPY  2006    OB History    No data available       Home Medications    Prior to Admission medications   Medication Sig Start Date End Date Taking? Authorizing Provider  aspirin 81 MG tablet Take 81 mg by mouth daily.   Yes [provider]  hydrochlorothiazide (HYDRODIURIL) 25 MG tablet Take  25 mg by mouth daily.   Yes [provider]  lisinopril (PRINIVIL,ZESTRIL) 30 MG tablet Take 30 mg by mouth daily. 11/29/16  Yes [provider]  Multiple Vitamin (MULTIVITAMIN) capsule Take 1 capsule by mouth daily.   Yes [provider]    Family History Family History  Problem Relation Age of Onset  . Hypertension Mother   . Hypertension Sister   . Hypertension Maternal Aunt   . Heart disease Maternal Aunt   . Hypertension Maternal Uncle     Social History Social History  Substance Use Topics  . Smoking status: Never Smoker  . Smokeless tobacco: Never Used  . Alcohol use No     Allergies   Penicillins   Review of Systems Review of Systems  Constitutional: Negative for chills and fever.  Respiratory: Negative for shortness of breath.   Gastrointestinal: Positive for abdominal pain, diarrhea, nausea and vomiting. Negative for blood in stool.  Genitourinary: Negative for dysuria.  All other systems reviewed and are negative.    Physical Exam Updated Vital Signs BP 133/79 (BP Location: Right Arm)   Pulse (!) 120   Temp 98.7 F (37.1 C) (Oral)   Resp 17   SpO2 100%   Physical Exam  Constitutional: She is oriented to person, place, and time. She appears well-developed and well-nourished. No distress.  HENT:  Head: Normocephalic and atraumatic.  Mucous membranes dry  Cardiovascular: Normal rate, regular rhythm and normal heart sounds.   Pulmonary/Chest: Effort normal  and breath sounds normal. No respiratory distress. She has no wheezes.  Abdominal: Soft. Bowel sounds are normal. She exhibits no mass. There is tenderness. There is no rebound and no guarding.  Diffuse tenderness to palpation, no rebound or guarding  Neurological: She is alert and oriented to person, place, and time.  Skin: Skin is warm and dry.  Psychiatric: She has a normal mood and affect.  Nursing note and vitals reviewed.    ED Treatments / Results  DIAGNOSTIC  STUDIES: Oxygen Saturation is 100% on RA, NL by my interpretation.    COORDINATION OF CARE: 2:01 AM-Discussed next steps with pt. Pt verbalized understanding and is agreeable with the plan. Will order IV and labs.   Labs (all labs ordered are listed, but only abnormal results are displayed) Labs Reviewed  C DIFFICILE QUICK SCREEN W PCR REFLEX - Abnormal; Notable for the following:       Result Value   C Diff antigen POSITIVE (*)    C Diff toxin POSITIVE (*)    All other components within normal limits  COMPREHENSIVE METABOLIC PANEL - Abnormal; Notable for the following:    Sodium 133 (*)    Potassium 2.9 (*)    Chloride 98 (*)    Glucose, Bld 152 (*)    Creatinine, Ser 1.09 (*)    Calcium 7.7 (*)    Albumin 2.5 (*)    AST 53 (*)    GFR calc non Af Amer 52 (*)    All other components within normal limits  CBC - Abnormal; Notable for the following:    WBC 47.7 (*)    HCT 35.4 (*)    All other components within normal limits  DIFFERENTIAL - Abnormal; Notable for the following:    Neutro Abs 44.4 (*)    Monocytes Absolute 2.4 (*)    All other components within normal limits  CBC WITH DIFFERENTIAL/PLATELET - Abnormal; Notable for the following:    WBC 45.4 (*)    Hemoglobin 11.6 (*)    HCT 35.6 (*)    Neutro Abs 42.2 (*)    Monocytes Absolute 2.3 (*)    All other components within normal limits  I-STAT BETA HCG BLOOD, ED ([facility], [facility], [facility] ONLY) - Abnormal; Notable for the following:    I-stat hCG, quantitative 7.7 (*)    All other components within normal limits  LIPASE, BLOOD  URINALYSIS, ROUTINE W REFLEX MICROSCOPIC  HIV ANTIBODY (ROUTINE TESTING)  I-STAT TROPOININ, ED  POC URINE PREG, ED    EKG  EKG Interpretation  Date/Time:  Wednesday March 09 2017 22:51:58 EDT Ventricular Rate:  120 PR Interval:  126 QRS Duration: 80 QT Interval:  318 QTC Calculation: 449 R Axis:   21 Text Interpretation:  Sinus tachycardia Possible Left atrial enlargement Borderline ECG  Confirmed by Thayer Jew [phone]) on 03/10/2017 3:27:59 AM       Radiology Ct Abdomen Pelvis W Contrast  Result Date: 03/10/2017 CLINICAL DATA:  66 year old female with 3 weeks of diarrhea. EXAM: CT ABDOMEN AND PELVIS WITH CONTRAST TECHNIQUE: Multidetector CT imaging of the abdomen and pelvis was performed using the standard protocol following bolus administration of intravenous contrast. CONTRAST:  167mL ISOVUE-300 IOPAMIDOL (ISOVUE-300) INJECTION 61% COMPARISON:  None. FINDINGS: Lower chest: The visualized lung bases are clear. No intra-abdominal free air.  Small free fluid within the pelvis. Hepatobiliary: Multiple small hepatic hypodense lesions, incompletely characterized, likely cysts or hemangioma. The liver is otherwise unremarkable. No intrahepatic biliary ductal dilatation. The gallbladder is  unremarkable as well. Pancreas: Unremarkable. No pancreatic ductal dilatation or surrounding inflammatory changes. Spleen: Normal in size without focal abnormality. Adrenals/Urinary Tract: There is a 1 cm indeterminate left adrenal hypodense nodule, likely an adenoma. The right adrenal gland appears unremarkable. The kidneys, visualized ureters, and urinary bladder appear unremarkable. Stomach/Bowel: There is inflammatory changes and thickening of the entire colon and rectosigmoid consistent with pancolitis. Findings may be infectious in etiology such as pseudomembranous colitis or related to underlying inflammatory bowel disease such as ulcerative colitis. The full-thickness involvement however favors an infectious process. Correlation with history of recent antibiotic use and stool cultures recommended. There is no evidence of bowel obstruction. Normal appendix. Vascular/Lymphatic: No significant vascular findings are present. No enlarged abdominal or pelvic lymph nodes. Reproductive: Probable partial hysterectomy. The ovaries appear unremarkable. No pelvic masses. Other: None Musculoskeletal: Mild  degenerative changes of the spine. L4-L5 disc desiccation with vacuum phenomena. No acute fracture. IMPRESSION: Pancolitis most likely of an infectious etiology such as C. diff colitis. Correlation with history of recent antibiotic use and stool cultures recommended. No bowel obstruction. Normal appendix. Electronically Signed   By: Anner Crete M.D.   On: 03/10/2017 04:14    Procedures Procedures (including critical care time)  Medications Ordered in ED Medications  ondansetron (ZOFRAN) injection 4 mg (4 mg Intravenous Not Given 03/10/17 0241)  fentaNYL (SUBLIMAZE) injection 100 mcg (100 mcg Intravenous Not Given 03/10/17 0241)  metroNIDAZOLE (FLAGYL) IVPB 500 mg (0 mg Intravenous Stopped 03/10/17 0603)  vancomycin (VANCOCIN) 50 mg/mL oral solution 500 mg (500 mg Oral Given 03/10/17 0504)  aspirin EC tablet 81 mg (not administered)  enoxaparin (LOVENOX) injection 40 mg (40 mg Subcutaneous Given 03/10/17 0607)  acetaminophen (TYLENOL) tablet 650 mg (not administered)    Or  acetaminophen (TYLENOL) suppository 650 mg (not administered)  ondansetron (ZOFRAN) tablet 4 mg (not administered)    Or  ondansetron (ZOFRAN) injection 4 mg (not administered)  0.9 %  sodium chloride infusion ( Intravenous Transfusing/Transfer 03/10/17 0519)  potassium chloride 10 mEq in 100 mL IVPB (10 mEq Intravenous New Bag/Given 03/10/17 0607)  multivitamin with minerals tablet 1 tablet (not administered)  fentaNYL (SUBLIMAZE) injection 50 mcg (not administered)  sodium chloride 0.9 % bolus 1,000 mL (0 mLs Intravenous Stopped 03/10/17 0439)  iopamidol (ISOVUE-300) 61 % injection (100 mLs  Contrast Given 03/10/17 0316)  potassium chloride 10 mEq in 100 mL IVPB (10 mEq Intravenous Transfusing/Transfer 03/10/17 0518)     Initial Impression / Assessment and Plan / ED Course  I have reviewed the triage vital signs and the nursing notes.  Pertinent labs & imaging results that were available during my care of the patient  were reviewed by me and considered in my medical decision making (see chart for details).     Patient presents with 3 weeks of diarrhea. She is dry on exam. Tachycardic. Otherwise nontoxic. Lab work is notable for a white count of 47. Differential added. Patient was given fluids and pain and nausea medication.  Patient does report recent antibiotic use prior to onset of diarrhea. C. difficile screening was sent. This is positive. Patient was placed on enteric precautions. She was given oral vancomycin. Given her AK and dehydration, will admit for further management.   Final Clinical Impressions(s) / ED Diagnoses   Final diagnoses:  C. difficile diarrhea  AKI (acute kidney injury) ([place])  Dehydration  Hypokalemia    New Prescriptions Current Discharge Medication List     I personally performed the services described in  this documentation, which was scribed in my presence. The recorded information has been reviewed and is accurate.    Merryl Hacker, MD 03/10/17 [id number]

## 2017-03-10 NOTE — H&P (Signed)
History and Physical    [name] HBZ:[id number] DOB: [birth year] DOA: 03/10/2017  PCP: Patient, No Pcp Per  Patient coming from: Home  I have personally briefly reviewed patient's old medical records in [place]  Chief Complaint: Abd pain, diarrhea  HPI: [name] is a 66 y.o. female with medical history significant of HTN.  Patient presents to the ED with concern for persistent diarrhea for the past 3 weeks.  She did have ABx use about 1 month ago for dental infection.  Associated sharp upper abd pain, 7/10 severity, N/V, and mucus stools.  The associated symptoms began about 1 week ago.  Saw PCP on the 9th, got prescribed bactrim and lomotil.  Referred to GI but didn't have money to see specialist.  Didn't work so now in ED.   ED Course: WBC 47k! Rapid PCR is positive for C.Diff!   Review of Systems: As per HPI otherwise 10 point review of systems negative.   Past Medical History:  Diagnosis Date  . Hyperlipidemia   . Hypertension   . Obesity (BMI 30.0-34.9)     Past Surgical History:  Procedure Laterality Date  . COLONOSCOPY  2006     reports that she has never smoked. She has never used smokeless tobacco. She reports that she does not drink alcohol or use drugs.  Allergies  Allergen Reactions  . Penicillins     Rash     Family History  Problem Relation Age of Onset  . Hypertension Mother   . Hypertension Sister   . Hypertension Maternal Aunt   . Heart disease Maternal Aunt   . Hypertension Maternal Uncle      Prior to Admission medications   Medication Sig Start Date End Date Taking? Authorizing Provider  aspirin 81 MG tablet Take 81 mg by mouth daily.   Yes [provider]  hydrochlorothiazide (HYDRODIURIL) 25 MG tablet Take 25 mg by mouth daily.   Yes [provider]  lisinopril (PRINIVIL,ZESTRIL) 30 MG tablet Take 30 mg by mouth daily. 11/29/16  Yes [provider]  Multiple Vitamin (MULTIVITAMIN) capsule Take 1  capsule by mouth daily.   Yes [provider]    Physical Exam: Vitals:   03/10/17 0330 03/10/17 0345 03/10/17 0400 03/10/17 0415  BP: 139/87 137/72 119/77 123/71  Pulse: (!) 108 91 (!) 105 (!) 105  Resp: (!) 30 (!) 28 (!) 26 (!) 29  Temp:      TempSrc:      SpO2: 100% 99% 96% 99%    Constitutional: NAD, calm, comfortable Eyes: PERRL, lids and conjunctivae normal ENMT: Mucous membranes are moist. Posterior pharynx clear of any exudate or lesions.Normal dentition.  Neck: normal, supple, no masses, no thyromegaly Respiratory: clear to auscultation bilaterally, no wheezing, no crackles. Normal respiratory effort. No accessory muscle use.  Cardiovascular: Regular rate and rhythm, no murmurs / rubs / gallops. No extremity edema. 2+ pedal pulses. No carotid bruits.  Abdomen: no tenderness, no masses palpated. No hepatosplenomegaly. Bowel sounds positive.  Musculoskeletal: no clubbing / cyanosis. No joint deformity upper and lower extremities. Good ROM, no contractures. Normal muscle tone.  Skin: no rashes, lesions, ulcers. No induration Neurologic: CN 2-12 grossly intact. Sensation intact, DTR normal. Strength 5/5 in all 4.  Psychiatric: Normal judgment and insight. Alert and oriented x 3. Normal mood.    Labs on Admission: I have personally reviewed following labs and imaging studies  CBC:  Recent Labs Lab 03/09/17 2242 03/10/17 0243  WBC 47.7* 45.4*  NEUTROABS 44.4* 42.2*  HGB 12.1 11.6*  HCT 35.4* 35.6*  MCV 84.1 85.2  PLT 375 366   Basic Metabolic Panel:  Recent Labs Lab 03/09/17 2242  NA 133*  K 2.9*  CL 98*  CO2 23  GLUCOSE 152*  BUN 14  CREATININE 1.09*  CALCIUM 7.7*   GFR: CrCl cannot be calculated (Unknown ideal weight.). Liver Function Tests:  Recent Labs Lab 03/09/17 2242  AST 53*  ALT 30  ALKPHOS 125  BILITOT 0.6  PROT 7.9  ALBUMIN 2.5*    Recent Labs Lab 03/09/17 2242  LIPASE 16   No results for input(s): AMMONIA in the last  168 hours. Coagulation Profile: No results for input(s): INR, PROTIME in the last 168 hours. Cardiac Enzymes: No results for input(s): CKTOTAL, CKMB, CKMBINDEX, TROPONINI in the last 168 hours. BNP (last 3 results) No results for input(s): PROBNP in the last 8760 hours. HbA1C: No results for input(s): HGBA1C in the last 72 hours. CBG: No results for input(s): GLUCAP in the last 168 hours. Lipid Profile: No results for input(s): CHOL, HDL, LDLCALC, TRIG, CHOLHDL, LDLDIRECT in the last 72 hours. Thyroid Function Tests: No results for input(s): TSH, T4TOTAL, FREET4, T3FREE, THYROIDAB in the last 72 hours. Anemia Panel: No results for input(s): VITAMINB12, FOLATE, FERRITIN, TIBC, IRON, RETICCTPCT in the last 72 hours. Urine analysis:    Component Value Date/Time   COLORURINE YELLOW 04/17/2007 [place] 04/17/2007 1204   LABSPEC 1.020 06/26/2012 1201   PHURINE 7.0 06/26/2012 1201   GLUCOSEU NEGATIVE 06/26/2012 1201   HGBUR NEGATIVE 06/26/2012 1201   BILIRUBINUR NEGATIVE 06/26/2012 1201   KETONESUR NEGATIVE 06/26/2012 1201   PROTEINUR NEGATIVE 06/26/2012 1201   UROBILINOGEN 1.0 06/26/2012 1201   NITRITE NEGATIVE 06/26/2012 1201   LEUKOCYTESUR NEGATIVE 06/26/2012 1201    Radiological Exams on Admission: Ct Abdomen Pelvis W Contrast  Result Date: 03/10/2017 CLINICAL DATA:  66 year old female with 3 weeks of diarrhea. EXAM: CT ABDOMEN AND PELVIS WITH CONTRAST TECHNIQUE: Multidetector CT imaging of the abdomen and pelvis was performed using the standard protocol following bolus administration of intravenous contrast. CONTRAST:  182mL ISOVUE-300 IOPAMIDOL (ISOVUE-300) INJECTION 61% COMPARISON:  None. FINDINGS: Lower chest: The visualized lung bases are clear. No intra-abdominal free air.  Small free fluid within the pelvis. Hepatobiliary: Multiple small hepatic hypodense lesions, incompletely characterized, likely cysts or hemangioma. The liver is otherwise unremarkable. No  intrahepatic biliary ductal dilatation. The gallbladder is unremarkable as well. Pancreas: Unremarkable. No pancreatic ductal dilatation or surrounding inflammatory changes. Spleen: Normal in size without focal abnormality. Adrenals/Urinary Tract: There is a 1 cm indeterminate left adrenal hypodense nodule, likely an adenoma. The right adrenal gland appears unremarkable. The kidneys, visualized ureters, and urinary bladder appear unremarkable. Stomach/Bowel: There is inflammatory changes and thickening of the entire colon and rectosigmoid consistent with pancolitis. Findings may be infectious in etiology such as pseudomembranous colitis or related to underlying inflammatory bowel disease such as ulcerative colitis. The full-thickness involvement however favors an infectious process. Correlation with history of recent antibiotic use and stool cultures recommended. There is no evidence of bowel obstruction. Normal appendix. Vascular/Lymphatic: No significant vascular findings are present. No enlarged abdominal or pelvic lymph nodes. Reproductive: Probable partial hysterectomy. The ovaries appear unremarkable. No pelvic masses. Other: None Musculoskeletal: Mild degenerative changes of the spine. L4-L5 disc desiccation with vacuum phenomena. No acute fracture. IMPRESSION: Pancolitis most likely of an infectious etiology such as C. diff colitis. Correlation with history of recent antibiotic use and  stool cultures recommended. No bowel obstruction. Normal appendix. Electronically Signed   By: Anner Crete M.D.   On: 03/10/2017 04:14    EKG: Independently reviewed.  Assessment/Plan Principal Problem:   Clostridium difficile colitis Active Problems:   Neutrophilic leukocytosis    1. C.Diff colitis - 1. Will treat as severe given the Leukomoid reaction 2. IV flagyl and PO vanc 500mg  as per our protocol for severe C.Diff 3. Zofran PRN 4. Tylenol PRN 5. Fentanyl PRN severe pain 6. IVF 7. Advance diet as  tolerated 8. Replacing potassium with 4 runs for K 2.9 9. Repeat CBC tomorrow AM.  DVT prophylaxis: Lovenox Code Status: Full Family Communication: No family in room Disposition Plan: Home after admit Consults called: CM to see about getting PO vanc financed Admission status: Admit to inpatient - inpatient status due to severe C.Diff with leukomoid reaction, N/V and intolerance to POs   GARDNER, [place] [facility] Pager [phone]  If 7AM-7PM, please contact day team taking care of patient [email or web address] Password [facility]  03/10/2017, 4:44 AM

## 2017-03-10 NOTE — ED Notes (Signed)
Pt taken to CT via stretcher.

## 2017-03-10 NOTE — Progress Notes (Signed)
PROGRESS NOTE    [name]   NAT:[id number]  DOB: 17-Dec-1950  DOA: 03/10/2017 PCP: Patient, No Pcp Per   Brief Narrative:   [name] is a 66 y.o. female with medical history significant of HTN, HLD who presents to the ER for abdominal pain and profuse diarrhea for 3 wks. She has had minimal urine output.   She used antibiotics about 1 mo ago for a dental infection.  She was placed on Bactrim and Lomotil about a week ago by her PCP and referred to GI. She was not able to follow up.   Subjective: Diarrhea every 15-30 min. Beginning to make urine now. Mild nausea but able to drink Ensure without trouble  Assessment & Plan:   Principal Problem:   Clostridium difficile colitis/ severe leukocytosis/ fever 100.4, tachycardia 113 >> sepsis - CT Abd/pelvis with contrast 7/12- showing pancolitis - cont IV Flagyl and oral Vancomycin at 500 mg QID - check Lactic acid - continue IVF - I and O - document stool and urine output separately  - Full Liquid diet - Ensure TID  Active Problems:   Essential hypertension - hold HCTZ and Lisinopril  Left adrenal nodule - 1 cm- incidental finding- likely adenoma- outpt f/u    DVT prophylaxis: Lovenox Code Status: Full code Family Communication:  Disposition Plan: home when stable Consultants:    Procedures:    Antimicrobials:  Anti-infectives    Start     Dose/Rate Route Frequency Ordered Stop   03/10/17 0600  vancomycin (VANCOCIN) 50 mg/mL oral solution 500 mg  Status:  Discontinued     500 mg Oral Every 6 hours 03/10/17 0409 03/10/17 0410   03/10/17 0500  vancomycin (VANCOCIN) 50 mg/mL oral solution 500 mg     500 mg Oral Every 6 hours 03/10/17 0410 03/24/17 0559   03/10/17 0430  metroNIDAZOLE (FLAGYL) IVPB 500 mg     500 mg 100 mL/hr over 60 Minutes Intravenous Every 8 hours 03/10/17 0409 03/24/17 0559   03/10/17 0400  vancomycin (VANCOCIN) 50 mg/mL oral solution 125 mg  Status:  Discontinued     125 mg Oral 3 times  daily before meals & bedtime 03/10/17 0334 03/10/17 0409       Objective: Vitals:   03/10/17 0515 03/10/17 0544 03/10/17 1433 03/10/17 1554  BP: 119/61 134/72 (!) 103/57   Pulse: (!) 110 (!) 115 (!) 113   Resp: (!) 25 18 18    Temp:  98 F (36.7 C) (!) 100.4 F (38 C)   TempSrc:      SpO2: 100% 96% 99%   Weight:    68.9 kg (152 lb)  Height:    5\' 3"  (1.6 m)    Intake/Output Summary (Last 24 hours) at 03/10/17 1626 Last data filed at 03/10/17 1200  Gross per 24 hour  Intake                0 ml  Output              250 ml  Net             -250 ml   Filed Weights   03/10/17 1554  Weight: 68.9 kg (152 lb)    Examination: General exam: Appears comfortable  HEENT: PERRLA, oral mucosa moist, no sclera icterus or thrush Respiratory system: Clear to auscultation. Respiratory effort normal. Cardiovascular system: S1 & S2 heard, RRR.  No murmurs  Gastrointestinal system: Abdomen soft, diffuse tenderness, nondistended. Normal bowel sound. No organomegaly Central  nervous system: Alert and oriented. No focal neurological deficits. Extremities: No cyanosis, clubbing or edema Skin: No rashes or ulcers Psychiatry:  Mood & affect appropriate.     Data Reviewed: I have personally reviewed following labs and imaging studies  CBC:  Recent Labs Lab 03/09/17 2242 03/10/17 0243  WBC 47.7* 45.4*  NEUTROABS 44.4* 42.2*  HGB 12.1 11.6*  HCT 35.4* 35.6*  MCV 84.1 85.2  PLT 375 100   Basic Metabolic Panel:  Recent Labs Lab 03/09/17 2242 03/10/17 0757  NA 133* 136  K 2.9* 3.7  CL 98* 103  CO2 23 23  GLUCOSE 152* 129*  BUN 14 12  CREATININE 1.09* 0.90  CALCIUM 7.7* 7.3*  MG  --  2.1   GFR: Estimated Creatinine Clearance: 58 mL/min (by C-G formula based on SCr of 0.9 mg/dL). Liver Function Tests:  Recent Labs Lab 03/09/17 2242  AST 53*  ALT 30  ALKPHOS 125  BILITOT 0.6  PROT 7.9  ALBUMIN 2.5*    Recent Labs Lab 03/09/17 2242  LIPASE 16   No results for  input(s): AMMONIA in the last 168 hours. Coagulation Profile: No results for input(s): INR, PROTIME in the last 168 hours. Cardiac Enzymes: No results for input(s): CKTOTAL, CKMB, CKMBINDEX, TROPONINI in the last 168 hours. BNP (last 3 results) No results for input(s): PROBNP in the last 8760 hours. HbA1C: No results for input(s): HGBA1C in the last 72 hours. CBG: No results for input(s): GLUCAP in the last 168 hours. Lipid Profile: No results for input(s): CHOL, HDL, LDLCALC, TRIG, CHOLHDL, LDLDIRECT in the last 72 hours. Thyroid Function Tests: No results for input(s): TSH, T4TOTAL, FREET4, T3FREE, THYROIDAB in the last 72 hours. Anemia Panel: No results for input(s): VITAMINB12, FOLATE, FERRITIN, TIBC, IRON, RETICCTPCT in the last 72 hours. Urine analysis:    Component Value Date/Time   COLORURINE YELLOW 04/17/2007 [place] 04/17/2007 1204   LABSPEC 1.020 06/26/2012 1201   PHURINE 7.0 06/26/2012 1201   GLUCOSEU NEGATIVE 06/26/2012 1201   HGBUR NEGATIVE 06/26/2012 1201   BILIRUBINUR NEGATIVE 06/26/2012 1201   KETONESUR NEGATIVE 06/26/2012 1201   PROTEINUR NEGATIVE 06/26/2012 1201   UROBILINOGEN 1.0 06/26/2012 1201   NITRITE NEGATIVE 06/26/2012 1201   LEUKOCYTESUR NEGATIVE 06/26/2012 1201   Sepsis Labs: @LABRCNTIP (procalcitonin:4,lacticidven:4) ) Recent Results (from the past 240 hour(s))  Gastrointestinal Panel by PCR , Stool     Status: None   Collection Time: 03/03/17  5:06 PM  Result Value Ref Range Status   Campylobacter species NOT DETECTED NOT DETECTED Final   Plesimonas shigelloides NOT DETECTED NOT DETECTED Final   Salmonella species NOT DETECTED NOT DETECTED Final   Yersinia enterocolitica NOT DETECTED NOT DETECTED Final   Vibrio species NOT DETECTED NOT DETECTED Final   Vibrio cholerae NOT DETECTED NOT DETECTED Final   Enteroaggregative E coli (EAEC) NOT DETECTED NOT DETECTED Final   Enteropathogenic E coli (EPEC) NOT DETECTED NOT DETECTED  Final   Enterotoxigenic E coli (ETEC) NOT DETECTED NOT DETECTED Final   Shiga like toxin producing E coli (STEC) NOT DETECTED NOT DETECTED Final   Shigella/Enteroinvasive E coli (EIEC) NOT DETECTED NOT DETECTED Final   Cryptosporidium NOT DETECTED NOT DETECTED Final   Cyclospora cayetanensis NOT DETECTED NOT DETECTED Final   Entamoeba histolytica NOT DETECTED NOT DETECTED Final   Giardia lamblia NOT DETECTED NOT DETECTED Final   Adenovirus F40/41 NOT DETECTED NOT DETECTED Final   Astrovirus NOT DETECTED NOT DETECTED Final   Norovirus GI/GII NOT DETECTED  NOT DETECTED Final   Rotavirus A NOT DETECTED NOT DETECTED Final   Sapovirus (I, II, IV, and V) NOT DETECTED NOT DETECTED Final  C difficile quick screen w PCR reflex     Status: Abnormal   Collection Time: 03/10/17  2:09 AM  Result Value Ref Range Status   C Diff antigen POSITIVE (A) NEGATIVE Final   C Diff toxin POSITIVE (A) NEGATIVE Final   C Diff interpretation Toxin producing C. difficile detected.  Final    Comment: CRITICAL RESULT CALLED TO, READ BACK BY AND VERIFIED WITH: M.BROWN,RN AT 0324 03/10/17,BY L.PITT          Radiology Studies: Ct Abdomen Pelvis W Contrast  Result Date: 03/10/2017 CLINICAL DATA:  66 year old female with 3 weeks of diarrhea. EXAM: CT ABDOMEN AND PELVIS WITH CONTRAST TECHNIQUE: Multidetector CT imaging of the abdomen and pelvis was performed using the standard protocol following bolus administration of intravenous contrast. CONTRAST:  157mL ISOVUE-300 IOPAMIDOL (ISOVUE-300) INJECTION 61% COMPARISON:  None. FINDINGS: Lower chest: The visualized lung bases are clear. No intra-abdominal free air.  Small free fluid within the pelvis. Hepatobiliary: Multiple small hepatic hypodense lesions, incompletely characterized, likely cysts or hemangioma. The liver is otherwise unremarkable. No intrahepatic biliary ductal dilatation. The gallbladder is unremarkable as well. Pancreas: Unremarkable. No pancreatic ductal  dilatation or surrounding inflammatory changes. Spleen: Normal in size without focal abnormality. Adrenals/Urinary Tract: There is a 1 cm indeterminate left adrenal hypodense nodule, likely an adenoma. The right adrenal gland appears unremarkable. The kidneys, visualized ureters, and urinary bladder appear unremarkable. Stomach/Bowel: There is inflammatory changes and thickening of the entire colon and rectosigmoid consistent with pancolitis. Findings may be infectious in etiology such as pseudomembranous colitis or related to underlying inflammatory bowel disease such as ulcerative colitis. The full-thickness involvement however favors an infectious process. Correlation with history of recent antibiotic use and stool cultures recommended. There is no evidence of bowel obstruction. Normal appendix. Vascular/Lymphatic: No significant vascular findings are present. No enlarged abdominal or pelvic lymph nodes. Reproductive: Probable partial hysterectomy. The ovaries appear unremarkable. No pelvic masses. Other: None Musculoskeletal: Mild degenerative changes of the spine. L4-L5 disc desiccation with vacuum phenomena. No acute fracture. IMPRESSION: Pancolitis most likely of an infectious etiology such as C. diff colitis. Correlation with history of recent antibiotic use and stool cultures recommended. No bowel obstruction. Normal appendix. Electronically Signed   By: Anner Crete M.D.   On: 03/10/2017 04:14      Scheduled Meds: . aspirin EC  81 mg Oral Daily  . dicyclomine  10 mg Oral TID AC & HS  . enoxaparin (LOVENOX) injection  40 mg Subcutaneous Q24H  . feeding supplement (ENSURE ENLIVE)  237 mL Oral TID BM  . multivitamin with minerals  1 tablet Oral Daily  . ondansetron (ZOFRAN) IV  4 mg Intravenous Once  . vancomycin  500 mg Oral Q6H   Continuous Infusions: . sodium chloride 125 mL/hr at 03/10/17 1414  . metronidazole Stopped (03/10/17 1514)     LOS: 0 days    Time spent in minutes:  35    Debbe Odea, MD [facility] Pager: [email or web address] Password [facility] 03/10/2017, 4:26 PM

## 2017-03-10 NOTE — Progress Notes (Signed)
CM received consult:Explain to patient what she does qualify for under Medicare (she thought she couldn't see a physician as outpatient even with referral because she "couldnt afford it". Patient does have medicare part A and B new this year.  CM spoke with pt  and explained coverage for part A & B of Medicare. Pt requesting PCP in  [place].  [place] @ [phone] will call pt to  arranged appointment time. Whitman Hero RN,BSN,CM

## 2017-03-10 NOTE — ED Notes (Signed)
Potassium run initated #1

## 2017-03-10 NOTE — Progress Notes (Addendum)
Initial Nutrition Assessment  DOCUMENTATION CODES:   Not applicable  INTERVENTION:   -Continue Ensure Enlive po TID, each supplement provides 350 kcal and 20 grams of protein -Continue MVI daily  NUTRITION DIAGNOSIS:   Predicted suboptimal nutrient intake related to altered GI function as evidenced by  (persistent diarrhea, 5% wt loss x 3 months).  GOAL:   Patient will meet greater than or equal to 90% of their needs  MONITOR:   PO intake, Supplement acceptance, Diet advancement, Labs, Weight trends, Skin, I & O's  REASON FOR ASSESSMENT:   Malnutrition Screening Tool    ASSESSMENT:   [name] is a 66 y.o. female with medical history significant of HTN.  Patient presents to the ED with concern for persistent diarrhea for the past 3 weeks.  Pt admitted with c-diff.   Pt using bathroom at time of visit. Unable to obtain further hx of perform nutrition-focused physical exam at this time.   Reviewed wt hx; noted a 5% wt loss over the past 3 months, which is not significant for time frame.   Suspect poor oral intake PTA, due to diarrhea. Pt is accepting Ensure supplements per [facility], currently on full liquid diet.   Labs reviewed.   Diet Order:  Diet full liquid Room service appropriate? Yes; Fluid consistency: Thin  Skin:  Reviewed, no issues  Last BM:  03/10/17  Height:   Ht Readings from Last 1 Encounters:  03/10/17 5\' 3"  (1.6 m)    Weight:   Wt Readings from Last 1 Encounters:  03/10/17 152 lb (68.9 kg)    Ideal Body Weight:  52.3 kg  BMI:  Body mass index is 26.93 kg/m.  Estimated Nutritional Needs:   Kcal:  1700-1900  Protein:  85-100 grams  Fluid:  1.7-1.9 L  EDUCATION NEEDS:   No education needs identified at this time  [name] A. Jimmye Norman, RD, LDN, CDE Pager: [phone] After hours Pager: [phone]

## 2017-03-10 NOTE — ED Notes (Signed)
Pt had episode of emesis after vanc. Administration. Small amount of emesis noted.

## 2017-03-11 DIAGNOSIS — D72825 Bandemia: Secondary | ICD-10-CM

## 2017-03-11 LAB — CBC
HCT: 31.5 % — ABNORMAL LOW (ref 36.0–46.0)
Hemoglobin: 10.1 g/dL — ABNORMAL LOW (ref 12.0–15.0)
MCH: 27.1 pg (ref 26.0–34.0)
MCHC: 32.1 g/dL (ref 30.0–36.0)
MCV: 84.5 fL (ref 78.0–100.0)
Platelets: 330 10*3/uL (ref 150–400)
RBC: 3.73 MIL/uL — ABNORMAL LOW (ref 3.87–5.11)
RDW: 14.3 % (ref 11.5–15.5)
WBC: 27.1 10*3/uL — ABNORMAL HIGH (ref 4.0–10.5)

## 2017-03-11 LAB — BASIC METABOLIC PANEL
Anion gap: 8 (ref 5–15)
BUN: 14 mg/dL (ref 6–20)
CO2: 20 mmol/L — ABNORMAL LOW (ref 22–32)
Calcium: 7.8 mg/dL — ABNORMAL LOW (ref 8.9–10.3)
Chloride: 111 mmol/L (ref 101–111)
Creatinine, Ser: 0.71 mg/dL (ref 0.44–1.00)
GFR calc Af Amer: >60 mL/min (ref >60)
GFR calc non Af Amer: >60 mL/min (ref >60)
Glucose, Bld: 119 mg/dL — ABNORMAL HIGH (ref 65–99)
Potassium: 4.2 mmol/L (ref 3.5–5.1)
Sodium: 139 mmol/L (ref 135–145)

## 2017-03-11 MED ORDER — HYDROCORTISONE 2.5 % RE CREA
1.0000 "application " | TOPICAL_CREAM | Freq: Four times a day (QID) | RECTAL | Status: DC
  Administered 2017-03-11 – 2017-03-13 (×6): 1 via RECTAL
  Filled 2017-03-11 (×2): qty 28.35

## 2017-03-11 MED ORDER — ENOXAPARIN SODIUM 40 MG/0.4ML ~~LOC~~ SOLN
40.0000 mg | SUBCUTANEOUS | Status: DC
  Administered 2017-03-12 – 2017-03-13 (×2): 40 mg via SUBCUTANEOUS
  Filled 2017-03-11 (×2): qty 0.4

## 2017-03-11 NOTE — Progress Notes (Signed)
Nutrition Follow-up  DOCUMENTATION CODES:   Not applicable  INTERVENTION:   -Continue Ensure Enlive po TID, each supplement provides 350 kcal and 20 grams of protein -RD will follow for diet advancement and adjust supplement regimen as appropriate  NUTRITION DIAGNOSIS:   Predicted suboptimal nutrient intake related to altered GI function as evidenced by  (persistent diarrhea, 5% wt loss x 3 months).  Progressing  GOAL:   Patient will meet greater than or equal to 90% of their needs  Progressing  MONITOR:   PO intake, Supplement acceptance, Diet advancement, Labs, Weight trends, Skin, I & O's  REASON FOR ASSESSMENT:   Malnutrition Screening Tool    ASSESSMENT:   [name] is a 66 y.o. female with medical history significant of HTN.  Patient presents to the ED with concern for persistent diarrhea for the past 3 weeks.  Spoke with pt, who reports she was intentionally trying to lose weight PTA. She reports that she does not like to cook, however, has been trying to make healthier food choices while making cooking convenient for her (consumes a lot of smoothies and food she can prepare in her toaster oven, such as vegetables). Pt reports she feels better after limiting junk foods in her diet.   Pt shares that her weight loss is intentional, as she started getting in the higher end of her usual weight range.   She shares diarrhea has improved and tolerating full liquids well; she reports consuming ice cream, pudding, jello, and Ensure this AM.   Nutrition-Focused physical exam completed. Findings are no fat depletion, no muscle depletion, and no edema.    Discussed rationale for full liquid diet and potential for diet advancement. Educated pt on importance of optimizing intake during hospitalization, however, can focus on lifestyle changes after recovering from acute illness.   Labs reviewed.   Diet Order:  Diet full liquid Room service appropriate? Yes; Fluid  consistency: Thin  Skin:  Reviewed, no issues  Last BM:  03/10/17  Height:   Ht Readings from Last 1 Encounters:  03/10/17 5\' 3"  (1.6 m)    Weight:   Wt Readings from Last 1 Encounters:  03/10/17 152 lb (68.9 kg)    Ideal Body Weight:  52.3 kg  BMI:  Body mass index is 26.93 kg/m.  Estimated Nutritional Needs:   Kcal:  1700-1900  Protein:  85-100 grams  Fluid:  1.7-1.9 L  EDUCATION NEEDS:   No education needs identified at this time  Chakia Counts A. Jimmye Norman, RD, LDN, CDE Pager: [phone] After hours Pager: [phone]

## 2017-03-11 NOTE — Progress Notes (Addendum)
PROGRESS NOTE    [name]   WUJ:[id number]  DOB: September 21, 1950  DOA: 03/10/2017 PCP: Patient, No Pcp Per   Brief Narrative:   [name] is a 66 y.o. female with medical history significant of HTN, HLD who presents to the ER for abdominal pain and profuse diarrhea for 3 wks. She has had minimal urine output.   She used antibiotics about 1 mo ago for a dental infection.  She was placed on Bactrim and Lomotil about a week ago by her PCP and referred to GI. She was not able to follow up.   Subjective: Diarrhea and abdominal pain not as severe. Feeling irritation in peri-anal area. Urine output improved. Drinking well without nausea.   Assessment & Plan:   Principal Problem:   Clostridium difficile colitis/ severe leukocytosis/ fever 100.4, tachycardia 113 >> sepsis - CT Abd/pelvis with contrast 7/12- showing pancolitis - cont IV Flagyl and oral Vancomycin at 500 mg QID - check Lactic acid - continue IVF - I and O - document stool and urine output separately  - Full Liquid diet - Ensure TID - appears to be improving clinically and per lab work- continue current managment  Active Problems:   Essential hypertension - hold HCTZ and Lisinopril  Hypokalemia - K 2.9 due to diarrhea - has been replaced adequately  Hyponatremia, dehydration - has resolved with aggressive IV hydration  Left adrenal nodule - 1 cm- incidental finding- likely adenoma- outpt f/u    DVT prophylaxis: Lovenox Code Status: Full code Family Communication:  Disposition Plan: home when stable Consultants:    Procedures:    Antimicrobials:  Anti-infectives    Start     Dose/Rate Route Frequency Ordered Stop   03/10/17 0600  vancomycin (VANCOCIN) 50 mg/mL oral solution 500 mg  Status:  Discontinued     500 mg Oral Every 6 hours 03/10/17 0409 03/10/17 0410   03/10/17 0500  vancomycin (VANCOCIN) 50 mg/mL oral solution 500 mg     500 mg Oral Every 6 hours 03/10/17 0410 03/24/17 0559   03/10/17  0430  metroNIDAZOLE (FLAGYL) IVPB 500 mg     500 mg 100 mL/hr over 60 Minutes Intravenous Every 8 hours 03/10/17 0409 03/24/17 0559   03/10/17 0400  vancomycin (VANCOCIN) 50 mg/mL oral solution 125 mg  Status:  Discontinued     125 mg Oral 3 times daily before meals & bedtime 03/10/17 0334 03/10/17 0409       Objective: Vitals:   03/10/17 1554 03/10/17 2139 03/11/17 0525 03/11/17 1350  BP:  132/68 115/72 129/78  Pulse:  (!) 108 100 (!) 108  Resp:  18 18 18   Temp:  98.4 F (36.9 C) 98.1 F (36.7 C) 98.9 F (37.2 C)  TempSrc:  Oral Oral   SpO2:  99% 99% 97%  Weight: 68.9 kg (152 lb)     Height: 5\' 3"  (1.6 m)       Intake/Output Summary (Last 24 hours) at 03/11/17 1548 Last data filed at 03/11/17 1350  Gross per 24 hour  Intake             4637 ml  Output             1825 ml  Net             2812 ml   Filed Weights   03/10/17 1554  Weight: 68.9 kg (152 lb)    Examination: General exam: Appears comfortable  HEENT: PERRLA, oral mucosa moist, no sclera icterus or  thrush Respiratory system: Clear to auscultation. Respiratory effort normal. Cardiovascular system: S1 & S2 heard, RRR.  No murmurs  Gastrointestinal system: Abdomen soft, mild diffuse tenderness, nondistended. Normal bowel sound. No organomegaly Central nervous system: Alert and oriented. No focal neurological deficits. Extremities: No cyanosis, clubbing or edema Skin: No rashes or ulcers Psychiatry:  Mood & affect appropriate.     Data Reviewed: I have personally reviewed following labs and imaging studies  CBC:  Recent Labs Lab 03/09/17 2242 03/10/17 0243 03/11/17 0358  WBC 47.7* 45.4* 27.1*  NEUTROABS 44.4* 42.2*  --   HGB 12.1 11.6* 10.1*  HCT 35.4* 35.6* 31.5*  MCV 84.1 85.2 84.5  PLT 375 367 638   Basic Metabolic Panel:  Recent Labs Lab 03/09/17 2242 03/10/17 0757 03/11/17 0358  NA 133* 136 139  K 2.9* 3.7 4.2  CL 98* 103 111  CO2 23 23 20*  GLUCOSE 152* 129* 119*  BUN 14 12 14     CREATININE 1.09* 0.90 0.71  CALCIUM 7.7* 7.3* 7.8*  MG  --  2.1  --    GFR: Estimated Creatinine Clearance: 65.3 mL/min (by C-G formula based on SCr of 0.71 mg/dL). Liver Function Tests:  Recent Labs Lab 03/09/17 2242  AST 53*  ALT 30  ALKPHOS 125  BILITOT 0.6  PROT 7.9  ALBUMIN 2.5*    Recent Labs Lab 03/09/17 2242  LIPASE 16   No results for input(s): AMMONIA in the last 168 hours. Coagulation Profile: No results for input(s): INR, PROTIME in the last 168 hours. Cardiac Enzymes: No results for input(s): CKTOTAL, CKMB, CKMBINDEX, TROPONINI in the last 168 hours. BNP (last 3 results) No results for input(s): PROBNP in the last 8760 hours. HbA1C: No results for input(s): HGBA1C in the last 72 hours. CBG: No results for input(s): GLUCAP in the last 168 hours. Lipid Profile: No results for input(s): CHOL, HDL, LDLCALC, TRIG, CHOLHDL, LDLDIRECT in the last 72 hours. Thyroid Function Tests: No results for input(s): TSH, T4TOTAL, FREET4, T3FREE, THYROIDAB in the last 72 hours. Anemia Panel: No results for input(s): VITAMINB12, FOLATE, FERRITIN, TIBC, IRON, RETICCTPCT in the last 72 hours. Urine analysis:    Component Value Date/Time   COLORURINE AMBER (A) 03/10/2017 1623   APPEARANCEUR HAZY (A) 03/10/2017 1623   LABSPEC 1.042 (H) 03/10/2017 1623   PHURINE 6.0 03/10/2017 1623   GLUCOSEU 50 (A) 03/10/2017 1623   HGBUR SMALL (A) 03/10/2017 1623   BILIRUBINUR NEGATIVE 03/10/2017 1623   KETONESUR 5 (A) 03/10/2017 1623   PROTEINUR 100 (A) 03/10/2017 1623   UROBILINOGEN 1.0 06/26/2012 1201   NITRITE NEGATIVE 03/10/2017 1623   LEUKOCYTESUR NEGATIVE 03/10/2017 1623   Sepsis Labs: @LABRCNTIP (procalcitonin:4,lacticidven:4) ) Recent Results (from the past 240 hour(s))  Gastrointestinal Panel by PCR , Stool     Status: None   Collection Time: 03/03/17  5:06 PM  Result Value Ref Range Status   Campylobacter species NOT DETECTED NOT DETECTED Final   Plesimonas  shigelloides NOT DETECTED NOT DETECTED Final   Salmonella species NOT DETECTED NOT DETECTED Final   Yersinia enterocolitica NOT DETECTED NOT DETECTED Final   Vibrio species NOT DETECTED NOT DETECTED Final   Vibrio cholerae NOT DETECTED NOT DETECTED Final   Enteroaggregative E coli (EAEC) NOT DETECTED NOT DETECTED Final   Enteropathogenic E coli (EPEC) NOT DETECTED NOT DETECTED Final   Enterotoxigenic E coli (ETEC) NOT DETECTED NOT DETECTED Final   Shiga like toxin producing E coli (STEC) NOT DETECTED NOT DETECTED Final   Shigella/Enteroinvasive E  coli (EIEC) NOT DETECTED NOT DETECTED Final   Cryptosporidium NOT DETECTED NOT DETECTED Final   Cyclospora cayetanensis NOT DETECTED NOT DETECTED Final   Entamoeba histolytica NOT DETECTED NOT DETECTED Final   Giardia lamblia NOT DETECTED NOT DETECTED Final   Adenovirus F40/41 NOT DETECTED NOT DETECTED Final   Astrovirus NOT DETECTED NOT DETECTED Final   Norovirus GI/GII NOT DETECTED NOT DETECTED Final   Rotavirus A NOT DETECTED NOT DETECTED Final   Sapovirus (I, II, IV, and V) NOT DETECTED NOT DETECTED Final  C difficile quick screen w PCR reflex     Status: Abnormal   Collection Time: 03/10/17  2:09 AM  Result Value Ref Range Status   C Diff antigen POSITIVE (A) NEGATIVE Final   C Diff toxin POSITIVE (A) NEGATIVE Final   C Diff interpretation Toxin producing C. difficile detected.  Final    Comment: CRITICAL RESULT CALLED TO, READ BACK BY AND VERIFIED WITH: M.BROWN,RN AT 0324 03/10/17,BY L.PITT          Radiology Studies: Ct Abdomen Pelvis W Contrast  Result Date: 03/10/2017 CLINICAL DATA:  66 year old female with 3 weeks of diarrhea. EXAM: CT ABDOMEN AND PELVIS WITH CONTRAST TECHNIQUE: Multidetector CT imaging of the abdomen and pelvis was performed using the standard protocol following bolus administration of intravenous contrast. CONTRAST:  146mL ISOVUE-300 IOPAMIDOL (ISOVUE-300) INJECTION 61% COMPARISON:  None. FINDINGS: Lower  chest: The visualized lung bases are clear. No intra-abdominal free air.  Small free fluid within the pelvis. Hepatobiliary: Multiple small hepatic hypodense lesions, incompletely characterized, likely cysts or hemangioma. The liver is otherwise unremarkable. No intrahepatic biliary ductal dilatation. The gallbladder is unremarkable as well. Pancreas: Unremarkable. No pancreatic ductal dilatation or surrounding inflammatory changes. Spleen: Normal in size without focal abnormality. Adrenals/Urinary Tract: There is a 1 cm indeterminate left adrenal hypodense nodule, likely an adenoma. The right adrenal gland appears unremarkable. The kidneys, visualized ureters, and urinary bladder appear unremarkable. Stomach/Bowel: There is inflammatory changes and thickening of the entire colon and rectosigmoid consistent with pancolitis. Findings may be infectious in etiology such as pseudomembranous colitis or related to underlying inflammatory bowel disease such as ulcerative colitis. The full-thickness involvement however favors an infectious process. Correlation with history of recent antibiotic use and stool cultures recommended. There is no evidence of bowel obstruction. Normal appendix. Vascular/Lymphatic: No significant vascular findings are present. No enlarged abdominal or pelvic lymph nodes. Reproductive: Probable partial hysterectomy. The ovaries appear unremarkable. No pelvic masses. Other: None Musculoskeletal: Mild degenerative changes of the spine. L4-L5 disc desiccation with vacuum phenomena. No acute fracture. IMPRESSION: Pancolitis most likely of an infectious etiology such as C. diff colitis. Correlation with history of recent antibiotic use and stool cultures recommended. No bowel obstruction. Normal appendix. Electronically Signed   By: Anner Crete M.D.   On: 03/10/2017 04:14      Scheduled Meds: . aspirin EC  81 mg Oral Daily  . dicyclomine  10 mg Oral TID AC & HS  . enoxaparin (LOVENOX)  injection  40 mg Subcutaneous Q24H  . feeding supplement (ENSURE ENLIVE)  237 mL Oral TID BM  . multivitamin with minerals  1 tablet Oral Daily  . ondansetron (ZOFRAN) IV  4 mg Intravenous Once  . vancomycin  500 mg Oral Q6H   Continuous Infusions: . sodium chloride 125 mL/hr at 03/11/17 1147  . metronidazole Stopped (03/11/17 1408)  . sodium chloride Stopped (03/10/17 2324)     LOS: 1 day    Time spent in minutes: 35  Debbe Odea, MD [facility] Pager: [email or web address] Password [facility] 03/11/2017, 3:48 PM

## 2017-03-12 LAB — CBC
HCT: 32.4 % — ABNORMAL LOW (ref 36.0–46.0)
Hemoglobin: 10.7 g/dL — ABNORMAL LOW (ref 12.0–15.0)
MCH: 27.6 pg (ref 26.0–34.0)
MCHC: 33 g/dL (ref 30.0–36.0)
MCV: 83.7 fL (ref 78.0–100.0)
Platelets: 325 10*3/uL (ref 150–400)
RBC: 3.87 MIL/uL (ref 3.87–5.11)
RDW: 14.8 % (ref 11.5–15.5)
WBC: 11.8 10*3/uL — ABNORMAL HIGH (ref 4.0–10.5)

## 2017-03-12 LAB — BASIC METABOLIC PANEL
Anion gap: 6 (ref 5–15)
BUN: 8 mg/dL (ref 6–20)
CO2: 25 mmol/L (ref 22–32)
Calcium: 7.9 mg/dL — ABNORMAL LOW (ref 8.9–10.3)
Chloride: 105 mmol/L (ref 101–111)
Creatinine, Ser: 0.56 mg/dL (ref 0.44–1.00)
GFR calc Af Amer: >60 mL/min (ref >60)
GFR calc non Af Amer: >60 mL/min (ref >60)
Glucose, Bld: 127 mg/dL — ABNORMAL HIGH (ref 65–99)
Potassium: 3.6 mmol/L (ref 3.5–5.1)
Sodium: 136 mmol/L (ref 135–145)

## 2017-03-12 MED ORDER — SODIUM CHLORIDE 0.9 % IV SOLN
INTRAVENOUS | Status: DC
  Administered 2017-03-13: 07:00:00 via INTRAVENOUS

## 2017-03-12 MED ORDER — ALUM & MAG HYDROXIDE-SIMETH 200-200-20 MG/5ML PO SUSP
30.0000 mL | ORAL | Status: DC | PRN
  Administered 2017-03-12: 30 mL via ORAL
  Filled 2017-03-12: qty 30

## 2017-03-12 NOTE — Progress Notes (Addendum)
PROGRESS NOTE    Arika Mainer   ZOX:[id number]  DOB: 1951/01/10  DOA: 03/10/2017 PCP: Patient, No Pcp Per   Brief Narrative:   [name] is a 66 y.o. female with medical history significant of HTN, HLD who presents to the ER for abdominal pain and profuse diarrhea for 3 wks. She has had minimal urine output.   She used antibiotics about 1 mo ago for a dental infection.  She was placed on Bactrim and Lomotil about a week ago by her PCP and referred to GI. She was not able to follow up.   Subjective: Stool output decreasing. Voiding well. Abdominal pain improving. Using cream for peri-anal area which is helping.  Assessment & Plan:   Principal Problem:   Clostridium difficile colitis/ severe leukocytosis/ fever 100.4, tachycardia 113 >> sepsis - CT Abd/pelvis with contrast 7/12- showing pancolitis - cont IV Flagyl and oral Vancomycin at 500 mg QID - continue IVF but decrease to 50 cc/hr - Full Liquid diet - Ensure TID- advance to soft diet today - WBC 47.7 >> 11.8 - appears to be improving clinically and per lab work- continue current managment  Active Problems:   Essential hypertension - hold HCTZ and Lisinopril  Hypokalemia - K 2.9 due to diarrhea - has been replaced adequately  Hyponatremia, dehydration - has resolved with aggressive IV hydration  Left adrenal nodule - 1 cm- incidental finding- likely adenoma- outpt f/u    DVT prophylaxis: Lovenox Code Status: Full code Family Communication:  Disposition Plan: home when stable Consultants:    Procedures:    Antimicrobials:  Anti-infectives    Start     Dose/Rate Route Frequency Ordered Stop   03/10/17 0600  vancomycin (VANCOCIN) 50 mg/mL oral solution 500 mg  Status:  Discontinued     500 mg Oral Every 6 hours 03/10/17 0409 03/10/17 0410   03/10/17 0500  vancomycin (VANCOCIN) 50 mg/mL oral solution 500 mg     500 mg Oral Every 6 hours 03/10/17 0410 03/24/17 0559   03/10/17 0430  metroNIDAZOLE  (FLAGYL) IVPB 500 mg     500 mg 100 mL/hr over 60 Minutes Intravenous Every 8 hours 03/10/17 0409 03/24/17 0559   03/10/17 0400  vancomycin (VANCOCIN) 50 mg/mL oral solution 125 mg  Status:  Discontinued     125 mg Oral 3 times daily before meals & bedtime 03/10/17 0334 03/10/17 0409       Objective: Vitals:   03/11/17 1350 03/11/17 2127 03/12/17 0505 03/12/17 1308  BP: 129/78 (!) 145/70 (!) 142/87 137/76  Pulse: (!) 108 (!) 102 (!) 103 99  Resp: 18 18 18 18   Temp: 98.9 F (37.2 C) 98.4 F (36.9 C) 97.7 F (36.5 C) 98.9 F (37.2 C)  TempSrc:   Oral Oral  SpO2: 97% 99% 96% 100%  Weight:      Height:        Intake/Output Summary (Last 24 hours) at 03/12/17 1529 Last data filed at 03/12/17 1404  Gross per 24 hour  Intake          5381.67 ml  Output             2800 ml  Net          2581.67 ml   Filed Weights   03/10/17 1554  Weight: 68.9 kg (152 lb)    Examination: General exam: Appears comfortable  HEENT: PERRLA, oral mucosa moist, no sclera icterus or thrush Respiratory system: Clear to auscultation. Respiratory effort normal. Cardiovascular system:  S1 & S2 heard, RRR.  No murmurs  Gastrointestinal system: Abdomen soft, very mild tenderness across lower abdomen, nondistended. Normal bowel sound. No organomegaly Central nervous system: Alert and oriented. No focal neurological deficits. Extremities: No cyanosis, clubbing - 1+ pitting edema in ankles Skin: No rashes or ulcers Psychiatry:  Mood & affect appropriate.     Data Reviewed: I have personally reviewed following labs and imaging studies  CBC:  Recent Labs Lab 03/09/17 2242 03/10/17 0243 03/11/17 0358 03/12/17 0909  WBC 47.7* 45.4* 27.1* 11.8*  NEUTROABS 44.4* 42.2*  --   --   HGB 12.1 11.6* 10.1* 10.7*  HCT 35.4* 35.6* 31.5* 32.4*  MCV 84.1 85.2 84.5 83.7  PLT 375 367 330 034   Basic Metabolic Panel:  Recent Labs Lab 03/09/17 2242 03/10/17 0757 03/11/17 0358 03/12/17 0909  NA 133* 136  139 136  K 2.9* 3.7 4.2 3.6  CL 98* 103 111 105  CO2 23 23 20* 25  GLUCOSE 152* 129* 119* 127*  BUN 14 12 14 8   CREATININE 1.09* 0.90 0.71 0.56  CALCIUM 7.7* 7.3* 7.8* 7.9*  MG  --  2.1  --   --    GFR: Estimated Creatinine Clearance: 65.3 mL/min (by C-G formula based on SCr of 0.56 mg/dL). Liver Function Tests:  Recent Labs Lab 03/09/17 2242  AST 53*  ALT 30  ALKPHOS 125  BILITOT 0.6  PROT 7.9  ALBUMIN 2.5*    Recent Labs Lab 03/09/17 2242  LIPASE 16   No results for input(s): AMMONIA in the last 168 hours. Coagulation Profile: No results for input(s): INR, PROTIME in the last 168 hours. Cardiac Enzymes: No results for input(s): CKTOTAL, CKMB, CKMBINDEX, TROPONINI in the last 168 hours. BNP (last 3 results) No results for input(s): PROBNP in the last 8760 hours. HbA1C: No results for input(s): HGBA1C in the last 72 hours. CBG: No results for input(s): GLUCAP in the last 168 hours. Lipid Profile: No results for input(s): CHOL, HDL, LDLCALC, TRIG, CHOLHDL, LDLDIRECT in the last 72 hours. Thyroid Function Tests: No results for input(s): TSH, T4TOTAL, FREET4, T3FREE, THYROIDAB in the last 72 hours. Anemia Panel: No results for input(s): VITAMINB12, FOLATE, FERRITIN, TIBC, IRON, RETICCTPCT in the last 72 hours. Urine analysis:    Component Value Date/Time   COLORURINE AMBER (A) 03/10/2017 1623   APPEARANCEUR HAZY (A) 03/10/2017 1623   LABSPEC 1.042 (H) 03/10/2017 1623   PHURINE 6.0 03/10/2017 1623   GLUCOSEU 50 (A) 03/10/2017 1623   HGBUR SMALL (A) 03/10/2017 1623   BILIRUBINUR NEGATIVE 03/10/2017 1623   KETONESUR 5 (A) 03/10/2017 1623   PROTEINUR 100 (A) 03/10/2017 1623   UROBILINOGEN 1.0 06/26/2012 1201   NITRITE NEGATIVE 03/10/2017 1623   LEUKOCYTESUR NEGATIVE 03/10/2017 1623   Sepsis Labs: @LABRCNTIP (procalcitonin:4,lacticidven:4) ) Recent Results (from the past 240 hour(s))  Gastrointestinal Panel by PCR , Stool     Status: None   Collection Time:  03/03/17  5:06 PM  Result Value Ref Range Status   Campylobacter species NOT DETECTED NOT DETECTED Final   Plesimonas shigelloides NOT DETECTED NOT DETECTED Final   Salmonella species NOT DETECTED NOT DETECTED Final   Yersinia enterocolitica NOT DETECTED NOT DETECTED Final   Vibrio species NOT DETECTED NOT DETECTED Final   Vibrio cholerae NOT DETECTED NOT DETECTED Final   Enteroaggregative E coli (EAEC) NOT DETECTED NOT DETECTED Final   Enteropathogenic E coli (EPEC) NOT DETECTED NOT DETECTED Final   Enterotoxigenic E coli (ETEC) NOT DETECTED NOT DETECTED Final  Shiga like toxin producing E coli (STEC) NOT DETECTED NOT DETECTED Final   Shigella/Enteroinvasive E coli (EIEC) NOT DETECTED NOT DETECTED Final   Cryptosporidium NOT DETECTED NOT DETECTED Final   Cyclospora cayetanensis NOT DETECTED NOT DETECTED Final   Entamoeba histolytica NOT DETECTED NOT DETECTED Final   Giardia lamblia NOT DETECTED NOT DETECTED Final   Adenovirus F40/41 NOT DETECTED NOT DETECTED Final   Astrovirus NOT DETECTED NOT DETECTED Final   Norovirus GI/GII NOT DETECTED NOT DETECTED Final   Rotavirus A NOT DETECTED NOT DETECTED Final   Sapovirus (I, II, IV, and V) NOT DETECTED NOT DETECTED Final  C difficile quick screen w PCR reflex     Status: Abnormal   Collection Time: 03/10/17  2:09 AM  Result Value Ref Range Status   C Diff antigen POSITIVE (A) NEGATIVE Final   C Diff toxin POSITIVE (A) NEGATIVE Final   C Diff interpretation Toxin producing C. difficile detected.  Final    Comment: CRITICAL RESULT CALLED TO, READ BACK BY AND VERIFIED WITH: M.BROWN,RN AT 0324 03/10/17,BY L.PITT          Radiology Studies: No results found.    Scheduled Meds: . aspirin EC  81 mg Oral Daily  . dicyclomine  10 mg Oral TID AC & HS  . enoxaparin (LOVENOX) injection  40 mg Subcutaneous Q24H  . feeding supplement (ENSURE ENLIVE)  237 mL Oral TID BM  . hydrocortisone  1 application Rectal QID  . multivitamin with  minerals  1 tablet Oral Daily  . ondansetron (ZOFRAN) IV  4 mg Intravenous Once  . vancomycin  500 mg Oral Q6H   Continuous Infusions: . sodium chloride 750 mL (03/12/17 1142)  . metronidazole Stopped (03/12/17 1504)  . sodium chloride Stopped (03/10/17 2324)     LOS: 2 days    Time spent in minutes: 35    Debbe Odea, MD [facility] Pager: [email or web address] Password [facility] 03/12/2017, 3:29 PM

## 2017-03-13 DIAGNOSIS — R6520 Severe sepsis without septic shock: Secondary | ICD-10-CM

## 2017-03-13 DIAGNOSIS — E278 Other specified disorders of adrenal gland: Secondary | ICD-10-CM

## 2017-03-13 DIAGNOSIS — E279 Disorder of adrenal gland, unspecified: Secondary | ICD-10-CM

## 2017-03-13 DIAGNOSIS — A419 Sepsis, unspecified organism: Secondary | ICD-10-CM

## 2017-03-13 LAB — CBC
HCT: 31 % — ABNORMAL LOW (ref 36.0–46.0)
Hemoglobin: 10.2 g/dL — ABNORMAL LOW (ref 12.0–15.0)
MCH: 27.5 pg (ref 26.0–34.0)
MCHC: 32.9 g/dL (ref 30.0–36.0)
MCV: 83.6 fL (ref 78.0–100.0)
Platelets: 293 10*3/uL (ref 150–400)
RBC: 3.71 MIL/uL — ABNORMAL LOW (ref 3.87–5.11)
RDW: 14.9 % (ref 11.5–15.5)
WBC: 8.6 10*3/uL (ref 4.0–10.5)

## 2017-03-13 LAB — BASIC METABOLIC PANEL
Anion gap: 5 (ref 5–15)
BUN: 7 mg/dL (ref 6–20)
CO2: 28 mmol/L (ref 22–32)
Calcium: 8 mg/dL — ABNORMAL LOW (ref 8.9–10.3)
Chloride: 103 mmol/L (ref 101–111)
Creatinine, Ser: 0.53 mg/dL (ref 0.44–1.00)
GFR calc Af Amer: >60 mL/min (ref >60)
GFR calc non Af Amer: >60 mL/min (ref >60)
Glucose, Bld: 102 mg/dL — ABNORMAL HIGH (ref 65–99)
Potassium: 3.8 mmol/L (ref 3.5–5.1)
Sodium: 136 mmol/L (ref 135–145)

## 2017-03-13 MED ORDER — VANCOMYCIN HCL 250 MG PO CAPS: 500.0000 mg | ORAL_CAPSULE | Freq: Four times a day (QID) | ORAL | 0 refills | Status: DC

## 2017-03-13 MED ORDER — ENSURE ENLIVE PO LIQD: 237.0000 mL | Freq: Three times a day (TID) | ORAL | 1 refills | Status: DC

## 2017-03-13 MED ORDER — DICYCLOMINE HCL 10 MG PO CAPS: 10.0000 mg | ORAL_CAPSULE | Freq: Three times a day (TID) | ORAL | 0 refills | Status: DC | PRN

## 2017-03-13 MED ORDER — HYDROCORTISONE 2.5 % RE CREA: 1.0000 "application " | TOPICAL_CREAM | Freq: Four times a day (QID) | RECTAL | 0 refills | Status: DC

## 2017-03-13 MED ORDER — ONDANSETRON HCL 4 MG PO TABS: 4.0000 mg | ORAL_TABLET | Freq: Four times a day (QID) | ORAL | 0 refills | Status: DC | PRN

## 2017-03-13 NOTE — Discharge Instructions (Signed)
If you need antibiotics again, you will either need to take Vancomycin or Flagyl as well. You have a small mass on your left adrenal gland. This is likely benign. Your family doctor will need to follow up on this.   Please take all your medications with you for your next visit with your Primary MD. Please request your Primary MD to go over all hospital test results at the follow up. Please ask your Primary MD to get all Hospital records sent to his/her office.  If you experience worsening of your admission symptoms, develop shortness of breath, chest pain, suicidal or homicidal thoughts or a life threatening emergency, you must seek medical attention immediately by calling 911 or calling your MD.  [name] must read the complete instructions/literature along with all the possible adverse reactions/side effects for all the medicines you take including new medications that have been prescribed to you. Take new medicines after you have completely understood and accpet all the possible adverse reactions/side effects.   Do not drive when taking pain medications or sedatives.    Do not take more than prescribed Pain, Sleep and Anxiety Medications  If you have smoked or chewed Tobacco in the last 2 yrs please stop. Stop any regular alcohol and or recreational drug use.  Wear Seat belts while driving.

## 2017-03-13 NOTE — Discharge Summary (Signed)
Physician Discharge Summary  [name] WRU:[id number] DOB: July 31, 1951 DOA: 03/10/2017  PCP: Azell Der, MD  Admit date: 03/10/2017 Discharge date: 03/13/2017  Admitted From: home Disposition:  home   Recommendations for Outpatient Follow-up:  1. If she will need future antibiotics, please give her either Oral Vancomycin or Flagyl at the same time to prevent recurrent C difficile infections.    Discharge Condition:  stable CODE STATUS:  Full code   Consultations:  none    Discharge Diagnoses:  Principal Problem:   Clostridium difficile colitis Active Problems:   Left adrenal mass ([place])   Severe sepsis ([place])   Essential hypertension   AKI (acute kidney injury) ([place])   Dehydration   Hypokalemia    Subjective: Diarrhea improving. No abdominal pain. Tolerating solid food. Urinating well. No dizziness when ambulating. No other complaints.   Brief Summary: [name] a 66 y.o.femalewith medical history significant of HTN, HLD who presents to the ER for abdominal pain and profuse diarrhea for 3 wks. She has had minimal urine output.   She used antibiotics about 1 mo ago for a dental infection.  For diarrhea, she was placed on Bactrim and Lomotil about a week ago by her PCP and referred to GI. She was not able to follow up.   Hospital Course:  Principal Problem: -  Clostridium difficile colitis/ severe leukocytosis -  fever 100.4, tachycardia 113, lactic acid 2.6 >> severe sepsis - CT Abd/pelvis with contrast 7/12- showing pancolitis - started on IV Flagyl, oral Vancomycin at 500 mg QID and continuous IVF- Lisinopril/ HCTZ stopped - WBC 47.7 >> 8.6 - has improved significantly- eating solid food since yesterday - is ready to d/c home - HIV neg  Active Problems:   Essential hypertension - held HCTZ and Lisinopril while acutely ill- can resume  Hypokalemia - K 2.9 due to diarrhea - has been replaced adequately  Hyponatremia, dehydration - has  resolved with aggressive IV hydration  Left adrenal nodule - 1 cm- incidental finding- likely adenoma- outpt f/u     Discharge Instructions  Discharge Instructions    Call MD for:  persistant nausea and vomiting    Complete by:  As directed    Or diarrhea   Call MD for:  temperature >100.4    Complete by:  As directed    Diet general    Complete by:  As directed    Low fiber diet, low sodium heart healthy. Drink 1-2 L of water a day.   Increase activity slowly    Complete by:  As directed    Increase activity slowly    Complete by:  As directed      Allergies as of 03/13/2017      Reactions   Penicillins    Rash       Medication List    TAKE these medications   aspirin 81 MG tablet Take 81 mg by mouth daily.   dicyclomine 10 MG capsule Commonly known as:  BENTYL Take 1 capsule (10 mg total) by mouth 3 (three) times daily as needed (abdominla cramps).   feeding supplement (ENSURE ENLIVE) Liqd Take 237 mLs by mouth 3 (three) times daily between meals.   hydrochlorothiazide 25 MG tablet Commonly known as:  HYDRODIURIL Take 25 mg by mouth daily.   hydrocortisone 2.5 % rectal cream Commonly known as:  ANUSOL-HC Place 1 application rectally 4 (four) times daily.   lisinopril 30 MG tablet Commonly known as:  PRINIVIL,ZESTRIL Take 30 mg by mouth  daily.   multivitamin capsule Take 1 capsule by mouth daily.   ondansetron 4 MG tablet Commonly known as:  ZOFRAN Take 1 tablet (4 mg total) by mouth every 6 (six) hours as needed for nausea.   vancomycin 250 MG capsule Commonly known as:  VANCOCIN Take 2 capsules (500 mg total) by mouth 4 (four) times daily. 500 mg 4 x day for 4 days Then 250 mg 4 X day for 7 days      Follow-up Information    [facility] Follow up.   Why:  office to call and set up appointment to establish PCP Contact information:          Address: [address] [place]  [place], [place] [address] Phone: [phone]         Allergies  Allergen Reactions  . Penicillins     Rash      Procedures/Studies:    Ct Abdomen Pelvis W Contrast  Result Date: 03/10/2017 CLINICAL DATA:  66 year old female with 3 weeks of diarrhea. EXAM: CT ABDOMEN AND PELVIS WITH CONTRAST TECHNIQUE: Multidetector CT imaging of the abdomen and pelvis was performed using the standard protocol following bolus administration of intravenous contrast. CONTRAST:  144mL ISOVUE-300 IOPAMIDOL (ISOVUE-300) INJECTION 61% COMPARISON:  None. FINDINGS: Lower chest: The visualized lung bases are clear. No intra-abdominal free air.  Small free fluid within the pelvis. Hepatobiliary: Multiple small hepatic hypodense lesions, incompletely characterized, likely cysts or hemangioma. The liver is otherwise unremarkable. No intrahepatic biliary ductal dilatation. The gallbladder is unremarkable as well. Pancreas: Unremarkable. No pancreatic ductal dilatation or surrounding inflammatory changes. Spleen: Normal in size without focal abnormality. Adrenals/Urinary Tract: There is a 1 cm indeterminate left adrenal hypodense nodule, likely an adenoma. The right adrenal gland appears unremarkable. The kidneys, visualized ureters, and urinary bladder appear unremarkable. Stomach/Bowel: There is inflammatory changes and thickening of the entire colon and rectosigmoid consistent with pancolitis. Findings may be infectious in etiology such as pseudomembranous colitis or related to underlying inflammatory bowel disease such as ulcerative colitis. The full-thickness involvement however favors an infectious process. Correlation with history of recent antibiotic use and stool cultures recommended. There is no evidence of bowel obstruction. Normal appendix. Vascular/Lymphatic: No significant vascular findings are present. No enlarged abdominal or pelvic lymph nodes. Reproductive: Probable partial hysterectomy. The ovaries appear unremarkable. No pelvic masses. Other:  None Musculoskeletal: Mild degenerative changes of the spine. L4-L5 disc desiccation with vacuum phenomena. No acute fracture. IMPRESSION: Pancolitis most likely of an infectious etiology such as C. diff colitis. Correlation with history of recent antibiotic use and stool cultures recommended. No bowel obstruction. Normal appendix. Electronically Signed   By: Anner Crete M.D.   On: 03/10/2017 04:14       Discharge Exam: Vitals:   03/12/17 2158 03/13/17 0543  BP: (!) 139/94 132/85  Pulse: (!) 105 94  Resp: 18 18  Temp: 98.5 F (36.9 C) 98.3 F (36.8 C)   Vitals:   03/12/17 0505 03/12/17 1308 03/12/17 2158 03/13/17 0543  BP: (!) 142/87 137/76 (!) 139/94 132/85  Pulse: (!) 103 99 (!) 105 94  Resp: 18 18 18 18   Temp: 97.7 F (36.5 C) 98.9 F (37.2 C) 98.5 F (36.9 C) 98.3 F (36.8 C)  TempSrc: Oral Oral    SpO2: 96% 100% 94% 99%  Weight:      Height:        General: Pt is alert, awake, not in acute distress Cardiovascular: RRR, S1/S2 +, no  rubs, no gallops Respiratory: CTA bilaterally, no wheezing, no rhonchi Abdominal: Soft, NT, ND, bowel sounds + Extremities: no edema, no cyanosis    The results of significant diagnostics from this hospitalization (including imaging, microbiology, ancillary and laboratory) are listed below for reference.     Microbiology: Recent Results (from the past 240 hour(s))  Gastrointestinal Panel by PCR , Stool     Status: None   Collection Time: 03/03/17  5:06 PM  Result Value Ref Range Status   Campylobacter species NOT DETECTED NOT DETECTED Final   Plesimonas shigelloides NOT DETECTED NOT DETECTED Final   Salmonella species NOT DETECTED NOT DETECTED Final   Yersinia enterocolitica NOT DETECTED NOT DETECTED Final   Vibrio species NOT DETECTED NOT DETECTED Final   Vibrio cholerae NOT DETECTED NOT DETECTED Final   Enteroaggregative E coli (EAEC) NOT DETECTED NOT DETECTED Final   Enteropathogenic E coli (EPEC) NOT DETECTED NOT DETECTED  Final   Enterotoxigenic E coli (ETEC) NOT DETECTED NOT DETECTED Final   Shiga like toxin producing E coli (STEC) NOT DETECTED NOT DETECTED Final   Shigella/Enteroinvasive E coli (EIEC) NOT DETECTED NOT DETECTED Final   Cryptosporidium NOT DETECTED NOT DETECTED Final   Cyclospora cayetanensis NOT DETECTED NOT DETECTED Final   Entamoeba histolytica NOT DETECTED NOT DETECTED Final   Giardia lamblia NOT DETECTED NOT DETECTED Final   Adenovirus F40/41 NOT DETECTED NOT DETECTED Final   Astrovirus NOT DETECTED NOT DETECTED Final   Norovirus GI/GII NOT DETECTED NOT DETECTED Final   Rotavirus A NOT DETECTED NOT DETECTED Final   Sapovirus (I, II, IV, and V) NOT DETECTED NOT DETECTED Final  C difficile quick screen w PCR reflex     Status: Abnormal   Collection Time: 03/10/17  2:09 AM  Result Value Ref Range Status   C Diff antigen POSITIVE (A) NEGATIVE Final   C Diff toxin POSITIVE (A) NEGATIVE Final   C Diff interpretation Toxin producing C. difficile detected.  Final    Comment: CRITICAL RESULT CALLED TO, READ BACK BY AND VERIFIED WITH: M.BROWN,RN AT 0324 03/10/17,BY L.PITT      Labs: BNP (last 3 results) No results for input(s): BNP in the last 8760 hours. Basic Metabolic Panel:  Recent Labs Lab 03/09/17 2242 03/10/17 0757 03/11/17 0358 03/12/17 0909 03/13/17 0530  NA 133* 136 139 136 136  K 2.9* 3.7 4.2 3.6 3.8  CL 98* 103 111 105 103  CO2 23 23 20* 25 28  GLUCOSE 152* 129* 119* 127* 102*  BUN 14 12 14 8 7   CREATININE 1.09* 0.90 0.71 0.56 0.53  CALCIUM 7.7* 7.3* 7.8* 7.9* 8.0*  MG  --  2.1  --   --   --    Liver Function Tests:  Recent Labs Lab 03/09/17 2242  AST 53*  ALT 30  ALKPHOS 125  BILITOT 0.6  PROT 7.9  ALBUMIN 2.5*    Recent Labs Lab 03/09/17 2242  LIPASE 16   No results for input(s): AMMONIA in the last 168 hours. CBC:  Recent Labs Lab 03/09/17 2242 03/10/17 0243 03/11/17 0358 03/12/17 0909 03/13/17 0530  WBC 47.7* 45.4* 27.1* 11.8* 8.6   NEUTROABS 44.4* 42.2*  --   --   --   HGB 12.1 11.6* 10.1* 10.7* 10.2*  HCT 35.4* 35.6* 31.5* 32.4* 31.0*  MCV 84.1 85.2 84.5 83.7 83.6  PLT 375 367 330 325 293   Cardiac Enzymes: No results for input(s): CKTOTAL, CKMB, CKMBINDEX, TROPONINI in the last 168 hours. BNP: Invalid input(s):  POCBNP CBG: No results for input(s): GLUCAP in the last 168 hours. D-Dimer No results for input(s): DDIMER in the last 72 hours. Hgb A1c No results for input(s): HGBA1C in the last 72 hours. Lipid Profile No results for input(s): CHOL, HDL, LDLCALC, TRIG, CHOLHDL, LDLDIRECT in the last 72 hours. Thyroid function studies No results for input(s): TSH, T4TOTAL, T3FREE, THYROIDAB in the last 72 hours.  Invalid input(s): FREET3 Anemia work up No results for input(s): VITAMINB12, FOLATE, FERRITIN, TIBC, IRON, RETICCTPCT in the last 72 hours. Urinalysis    Component Value Date/Time   COLORURINE AMBER (A) 03/10/2017 1623   APPEARANCEUR HAZY (A) 03/10/2017 1623   LABSPEC 1.042 (H) 03/10/2017 1623   PHURINE 6.0 03/10/2017 1623   GLUCOSEU 50 (A) 03/10/2017 1623   HGBUR SMALL (A) 03/10/2017 1623   BILIRUBINUR NEGATIVE 03/10/2017 1623   KETONESUR 5 (A) 03/10/2017 1623   PROTEINUR 100 (A) 03/10/2017 1623   UROBILINOGEN 1.0 06/26/2012 1201   NITRITE NEGATIVE 03/10/2017 1623   LEUKOCYTESUR NEGATIVE 03/10/2017 1623   Sepsis Labs Invalid input(s): PROCALCITONIN,  WBC,  LACTICIDVEN Microbiology Recent Results (from the past 240 hour(s))  Gastrointestinal Panel by PCR , Stool     Status: None   Collection Time: 03/03/17  5:06 PM  Result Value Ref Range Status   Campylobacter species NOT DETECTED NOT DETECTED Final   Plesimonas shigelloides NOT DETECTED NOT DETECTED Final   Salmonella species NOT DETECTED NOT DETECTED Final   Yersinia enterocolitica NOT DETECTED NOT DETECTED Final   Vibrio species NOT DETECTED NOT DETECTED Final   Vibrio cholerae NOT DETECTED NOT DETECTED Final   Enteroaggregative E  coli (EAEC) NOT DETECTED NOT DETECTED Final   Enteropathogenic E coli (EPEC) NOT DETECTED NOT DETECTED Final   Enterotoxigenic E coli (ETEC) NOT DETECTED NOT DETECTED Final   Shiga like toxin producing E coli (STEC) NOT DETECTED NOT DETECTED Final   Shigella/Enteroinvasive E coli (EIEC) NOT DETECTED NOT DETECTED Final   Cryptosporidium NOT DETECTED NOT DETECTED Final   Cyclospora cayetanensis NOT DETECTED NOT DETECTED Final   Entamoeba histolytica NOT DETECTED NOT DETECTED Final   Giardia lamblia NOT DETECTED NOT DETECTED Final   Adenovirus F40/41 NOT DETECTED NOT DETECTED Final   Astrovirus NOT DETECTED NOT DETECTED Final   Norovirus GI/GII NOT DETECTED NOT DETECTED Final   Rotavirus A NOT DETECTED NOT DETECTED Final   Sapovirus (I, II, IV, and V) NOT DETECTED NOT DETECTED Final  C difficile quick screen w PCR reflex     Status: Abnormal   Collection Time: 03/10/17  2:09 AM  Result Value Ref Range Status   C Diff antigen POSITIVE (A) NEGATIVE Final   C Diff toxin POSITIVE (A) NEGATIVE Final   C Diff interpretation Toxin producing C. difficile detected.  Final    Comment: CRITICAL RESULT CALLED TO, READ BACK BY AND VERIFIED WITH: M.BROWN,RN AT 0324 03/10/17,BY L.PITT      Time coordinating discharge: Over 30 minutes  SIGNED:   Debbe Odea, MD  [facility] 03/13/2017, 1:48 PM Pager   If 7PM-7AM, please contact night-coverage [email or web address] Password [email or web address]

## 2017-03-13 NOTE — Progress Notes (Signed)
Spoke w patient about [facility]IP[facility] and [facility]. Suggested patient call [facility] for appointment early tomorrow AM so she would be able to use their pharmacy tomorrow to fill her medications.

## 2017-03-13 NOTE — Progress Notes (Signed)
Patient was discharged home by MD order; discharged instructions  review and give to patient with care notes and prescriptions; IV DIC; patient will be escorted to the car by nurse tech via wheelchair.  

## 2017-03-14 ENCOUNTER — Encounter (HOSPITAL_COMMUNITY): Payer: Self-pay | Admitting: *Deleted

## 2017-03-14 ENCOUNTER — Emergency Department (HOSPITAL_COMMUNITY)
Admission: EM | Admit: 2017-03-14 | Discharge: 2017-03-15 | Disposition: A | Discharge status: Home / Self Care | Payer: Medicare Other | Source: Home / Self Care | Attending: Emergency Medicine | Admitting: Emergency Medicine

## 2017-03-14 DIAGNOSIS — I10 Essential (primary) hypertension: Secondary | ICD-10-CM | POA: Diagnosis not present

## 2017-03-14 DIAGNOSIS — E876 Hypokalemia: Secondary | ICD-10-CM | POA: Diagnosis not present

## 2017-03-14 DIAGNOSIS — R197 Diarrhea, unspecified: Secondary | ICD-10-CM | POA: Diagnosis not present

## 2017-03-14 DIAGNOSIS — R600 Localized edema: Secondary | ICD-10-CM | POA: Diagnosis not present

## 2017-03-14 DIAGNOSIS — R7989 Other specified abnormal findings of blood chemistry: Secondary | ICD-10-CM

## 2017-03-14 DIAGNOSIS — R609 Edema, unspecified: Secondary | ICD-10-CM | POA: Insufficient documentation

## 2017-03-14 DIAGNOSIS — Z79899 Other long term (current) drug therapy: Secondary | ICD-10-CM

## 2017-03-14 DIAGNOSIS — E860 Dehydration: Secondary | ICD-10-CM | POA: Diagnosis not present

## 2017-03-14 DIAGNOSIS — Z90710 Acquired absence of both cervix and uterus: Secondary | ICD-10-CM | POA: Diagnosis not present

## 2017-03-14 DIAGNOSIS — A0472 Enterocolitis due to Clostridium difficile, not specified as recurrent: Secondary | ICD-10-CM

## 2017-03-14 DIAGNOSIS — R740 Nonspecific elevation of levels of transaminase and lactic acid dehydrogenase [LDH]: Secondary | ICD-10-CM | POA: Diagnosis not present

## 2017-03-14 DIAGNOSIS — R945 Abnormal results of liver function studies: Secondary | ICD-10-CM

## 2017-03-14 DIAGNOSIS — E785 Hyperlipidemia, unspecified: Secondary | ICD-10-CM | POA: Diagnosis not present

## 2017-03-14 DIAGNOSIS — A0471 Enterocolitis due to Clostridium difficile, recurrent: Secondary | ICD-10-CM | POA: Diagnosis not present

## 2017-03-14 LAB — COMPREHENSIVE METABOLIC PANEL
ALT: 306 U/L — ABNORMAL HIGH (ref 14–54)
AST: 454 U/L — ABNORMAL HIGH (ref 15–41)
Albumin: 2.3 g/dL — ABNORMAL LOW (ref 3.5–5.0)
Alkaline Phosphatase: 195 U/L — ABNORMAL HIGH (ref 38–126)
Anion gap: 8 (ref 5–15)
BUN: 7 mg/dL (ref 6–20)
CO2: 28 mmol/L (ref 22–32)
Calcium: 8.4 mg/dL — ABNORMAL LOW (ref 8.9–10.3)
Chloride: 100 mmol/L — ABNORMAL LOW (ref 101–111)
Creatinine, Ser: 0.72 mg/dL (ref 0.44–1.00)
GFR calc Af Amer: >60 mL/min (ref >60)
GFR calc non Af Amer: >60 mL/min (ref >60)
Glucose, Bld: 107 mg/dL — ABNORMAL HIGH (ref 65–99)
Potassium: 3.8 mmol/L (ref 3.5–5.1)
Sodium: 136 mmol/L (ref 135–145)
Total Bilirubin: 0.4 mg/dL (ref 0.3–1.2)
Total Protein: 6.6 g/dL (ref 6.5–8.1)

## 2017-03-14 LAB — CBC
HCT: 32.8 % — ABNORMAL LOW (ref 36.0–46.0)
Hemoglobin: 10.6 g/dL — ABNORMAL LOW (ref 12.0–15.0)
MCH: 27 pg (ref 26.0–34.0)
MCHC: 32.3 g/dL (ref 30.0–36.0)
MCV: 83.7 fL (ref 78.0–100.0)
Platelets: 316 10*3/uL (ref 150–400)
RBC: 3.92 MIL/uL (ref 3.87–5.11)
RDW: 14.7 % (ref 11.5–15.5)
WBC: 10.6 10*3/uL — ABNORMAL HIGH (ref 4.0–10.5)

## 2017-03-14 LAB — LIPASE, BLOOD: Lipase: 52 U/L — ABNORMAL HIGH (ref 11–51)

## 2017-03-14 MED ORDER — METRONIDAZOLE 500 MG PO TABS
500.0000 mg | ORAL_TABLET | Freq: Once | ORAL | Status: AC
  Administered 2017-03-14: 500 mg via ORAL
  Filled 2017-03-14: qty 1

## 2017-03-14 NOTE — ED Triage Notes (Signed)
To ED for further eval of swelling. Discharged from hospital on Sunday evening. Dx with C-diff. Pt was unable to fill her scripts for antibiotics - would cost her over $900. Diarrhea has continued since going home. Pt complains of swelling and not feeling well now

## 2017-03-14 NOTE — ED Provider Notes (Signed)
[facility] DEPT Provider Note   CSN: [id number] Arrival date & time: 03/14/17  1528  By signing my name below, I, Margit Banda, attest that this documentation has been prepared under the direction and in the presence of Horton, Barbette Hair, MD. Electronically Signed: Margit Banda, ED Scribe. 03/14/17. 11:23 PM.  History   Chief Complaint Chief Complaint  Patient presents with  . Diarrhea    HPI [name] is a 66 y.o. female who presents to the Emergency Department complaining of gradually worsening c-diff that was diagnosed on 03/10/17. Associated sx include bilateral foot swelling, increased urinary output, urinary frequency, urinary odor, diarrhea, and improving abdominal pain. Pt was admitted on 03/10/17 after coming to the ED for persistent diarrhea that had started ~ 3 weeks prior. She was discharged this past Sunday, 03/13/17. Pt was unable to get all of her medication filled. Pt denies vomiting.  The history is provided by the patient. No language interpreter was used.    Past Medical History:  Diagnosis Date  . History of stomach ulcers 1970s   "in college"  . Hyperlipidemia   . Hypertension   . Obesity (BMI 30.0-34.9)     Patient Active Problem List   Diagnosis Date Noted  . Left adrenal mass ([place]) 03/13/2017  . Severe sepsis ([place]) 03/13/2017  . Clostridium difficile colitis 03/10/2017  . AKI (acute kidney injury) ([place])   . Dehydration   . Hypokalemia   . Essential hypertension 08/19/2006    Past Surgical History:  Procedure Laterality Date  . ABDOMINAL HYSTERECTOMY  2017   "partial; in [place]"  . BLADDER SUSPENSION  2017   "in [place]"  . COLONOSCOPY  2006  . COMBINED HYSTEROSCOPY DIAGNOSTIC / D&C  01/2005   Archie Endo 01/12/2011  . LAPAROSCOPY  1970s   Archie Endo 01/12/2011    OB History    No data available       Home Medications    Prior to Admission medications   Medication Sig Start Date End Date Taking? Authorizing Provider  aspirin 81  MG tablet Take 81 mg by mouth daily.   Yes [provider]  dicyclomine (BENTYL) 10 MG capsule Take 1 capsule (10 mg total) by mouth 3 (three) times daily as needed (abdominla cramps). 03/13/17  Yes Debbe Odea, MD  feeding supplement, ENSURE ENLIVE, (ENSURE ENLIVE) LIQD Take 237 mLs by mouth 3 (three) times daily between meals. 03/13/17  Yes Debbe Odea, MD  hydrochlorothiazide (HYDRODIURIL) 25 MG tablet Take 25 mg by mouth daily.   Yes [provider]  hydrocortisone (ANUSOL-HC) 2.5 % rectal cream Place 1 application rectally 4 (four) times daily. 03/13/17  Yes Debbe Odea, MD  lisinopril (PRINIVIL,ZESTRIL) 30 MG tablet Take 30 mg by mouth daily. 11/29/16  Yes [provider]  Multiple Vitamin (MULTIVITAMIN) capsule Take 1 capsule by mouth daily.   Yes [provider]  ondansetron (ZOFRAN) 4 MG tablet Take 1 tablet (4 mg total) by mouth every 6 (six) hours as needed for nausea. 03/13/17  Yes Debbe Odea, MD  metroNIDAZOLE (FLAGYL) 500 MG tablet Take 1 tablet (500 mg total) by mouth 3 (three) times daily. 03/15/17   Horton, Barbette Hair, MD  vancomycin (VANCOCIN) 250 MG capsule Take 2 capsules (500 mg total) by mouth 4 (four) times daily. 500 mg 4 x day for 4 days Then 250 mg 4 X day for 7 days 03/13/17   Debbe Odea, MD    Family History Family History  Problem Relation Age of Onset  .  Hypertension Mother   . Hypertension Sister   . Hypertension Maternal Aunt   . Heart disease Maternal Aunt   . Hypertension Maternal Uncle     Social History Social History  Substance Use Topics  . Smoking status: Never Smoker  . Smokeless tobacco: Never Used  . Alcohol use No     Allergies   Penicillins   Review of Systems Review of Systems  Constitutional: Negative for fever.  Gastrointestinal: Positive for abdominal pain and diarrhea. Negative for vomiting.  Genitourinary: Positive for frequency.  Musculoskeletal: Positive for joint swelling.  All other  systems reviewed and are negative.    Physical Exam Updated Vital Signs BP (!) 135/96   Pulse 93   Temp 98 F (36.7 C) (Oral)   Resp 16   SpO2 99%   Physical Exam  Constitutional: She is oriented to person, place, and time. She appears well-developed and well-nourished.  HENT:  Head: Normocephalic and atraumatic.  Mucous membranes dry  Cardiovascular: Normal rate, regular rhythm and normal heart sounds.   Pulmonary/Chest: Effort normal. No respiratory distress. She has no wheezes.  Abdominal: Soft. Bowel sounds are normal. There is no tenderness. There is no guarding.  Neurological: She is alert and oriented to person, place, and time.  Skin: Skin is warm and dry.  Psychiatric: She has a normal mood and affect.  Nursing note and vitals reviewed.    ED Treatments / Results  DIAGNOSTIC STUDIES: Oxygen Saturation is 100% on RA, normal by my interpretation.   COORDINATION OF CARE: 11:23 PM-Discussed next steps with pt which includes having her liver function tests redone. Pt verbalized understanding and is agreeable with the plan.   Labs (all labs ordered are listed, but only abnormal results are displayed) Labs Reviewed  LIPASE, BLOOD - Abnormal; Notable for the following:       Result Value   Lipase 52 (*)    All other components within normal limits  COMPREHENSIVE METABOLIC PANEL - Abnormal; Notable for the following:    Chloride 100 (*)    Glucose, Bld 107 (*)    Calcium 8.4 (*)    Albumin 2.3 (*)    AST 454 (*)    ALT 306 (*)    Alkaline Phosphatase 195 (*)    All other components within normal limits  CBC - Abnormal; Notable for the following:    WBC 10.6 (*)    Hemoglobin 10.6 (*)    HCT 32.8 (*)    All other components within normal limits    EKG  EKG Interpretation None       Radiology Dg Abdomen 1 View  Result Date: 03/15/2017 CLINICAL DATA:  Left upper posterior back pain with leg swelling. EXAM: ABDOMEN - 1 VIEW COMPARISON:  06/21/2012  lumbar spine, CT abdomen and pelvis 03/10/2017. FINDINGS: Mild segmental gaseous distention of small bowel loops centrally within the abdomen without definite bowel obstruction. Findings may reflect a sympathetic small bowel ileus secondary to pancolitis noted on recent CT. No free air noted. No organomegaly. No suspicious calculi. There is mid to lower lumbar degenerative disc and facet arthropathy. The bony pelvis and hips are intact. IMPRESSION: 1. Mid to lower lumbar disc and facet arthropathy. 2. Mild gaseous distention small bowel loops in the mid abdomen. Findings may reflect a sympathetic ileus. Electronically Signed   By: Ashley Royalty M.D.   On: 03/15/2017 00:57    Procedures Procedures (including critical care time)  Medications Ordered in ED Medications  metroNIDAZOLE (FLAGYL)  tablet 500 mg (500 mg Oral Given 03/14/17 2348)     Initial Impression / Assessment and Plan / ED Course  I have reviewed the triage vital signs and the nursing notes.  Pertinent labs & imaging results that were available during my care of the patient were reviewed by me and considered in my medical decision making (see chart for details).     Patient presents with recurrent worsening diarrhea after being discharged from the hospital for C. difficile colitis. She has been unable to get her oral vancomycin filled secondary to financial issues. She denies any other symptoms. She states that her abdominal pain has actually improved. However she has had increased frequency of stools and return of foul-smelling stools. Lab workup reviewed from triage. Notable for elevated LFTs. This was not present on her admission. Unclear etiology. Could be related to ongoing colitis. KUB obtained to evaluate and ensure no occasions including toxic megacolon. KUB largely reassuring. She does have mild distention of small bowel loops which suggest ileus. However, clinically she has no signs of ileus. I discussed at length with the  patient and her daughter options. Patient was given 1 dose of Flagyl in the ED. Will transition to Flagyl as an outpatient as this is likely more cost effective. However, given the severity of her initial symptoms, will also consult case management to call the patient tomorrow to see if she has any additional financial options for obtaining oral vancomycin as an outpatient.  She also needs to follow-up closely with her primary physician for recheck of her LFTs. Regarding her peripheral edema, she received significant fluids while in the hospital, this is likely delayed volume expansion. Continue to monitor. She denies any shortness of breath or associated symptoms. She was advised to return for any worsening symptoms including worsening pain, increased stooling, fevers.  After history, exam, and medical workup I feel the patient has been appropriately medically screened and is safe for discharge home. Pertinent diagnoses were discussed with the patient. Patient was given return precautions.   Final Clinical Impressions(s) / ED Diagnoses   Final diagnoses:  C. difficile colitis  Elevated LFTs  Peripheral edema    New Prescriptions Discharge Medication List as of 03/15/2017  1:07 AM    START taking these medications   Details  metroNIDAZOLE (FLAGYL) 500 MG tablet Take 1 tablet (500 mg total) by mouth 3 (three) times daily., Starting Tue 03/15/2017, Print       I personally performed the services described in this documentation, which was scribed in my presence. The recorded information has been reviewed and is accurate.     Merryl Hacker, MD 03/15/17 [phone]

## 2017-03-14 NOTE — ED Notes (Signed)
Pt family at [facility] requesting update, apologized for delay. Family and pt very understanding.

## 2017-03-14 NOTE — ED Notes (Signed)
Pt denies and SOB states being seen for C-diff

## 2017-03-14 NOTE — ED Notes (Signed)
Pt's daughter came to nurses station inquiring when a physician would be in.  Advised that MD changes shifts at 11pm and it would be closer to then that they will be seen.  Apologized for the wait and asked if there was anything we could do for them in the meantime.  Daughter states "well I guess there is nothing we can do about that can you?" Apologized again for the long wait.

## 2017-03-14 NOTE — ED Notes (Signed)
Pt denies having any SOB at any time.  C/o C-diff recurrance from recent visit.

## 2017-03-15 ENCOUNTER — Telehealth: Payer: Self-pay | Admitting: Surgery

## 2017-03-15 ENCOUNTER — Emergency Department (HOSPITAL_COMMUNITY): Payer: Medicare Other

## 2017-03-15 MED ORDER — METRONIDAZOLE 500 MG PO TABS: 500.0000 mg | ORAL_TABLET | Freq: Three times a day (TID) | ORAL | 0 refills | Status: DC

## 2017-03-15 NOTE — ED Notes (Signed)
Patient transported to X-ray 

## 2017-03-15 NOTE — Discharge Instructions (Signed)
You were seen today because you're unable to fill your antibiotics for C. difficile colitis. You  will be given a prescription for Flagyl as an alternative. You should receive a call from case management in regards to helping you obtain your prescriptions. Of note, on your lab count analysis, you were found to have elevated liver function tests. This could be related to your current inflamed bowel. You need to have repeat testing by her primary physician at the end of the week.  If you develop any new or worsening symptoms including worsening abdominal pain, worsening or increased stooling, fevers you should be reevaluated immediately.

## 2017-03-15 NOTE — ED Notes (Signed)
Pt stable, ambulatory, states understanding of discharge instructions 

## 2017-03-16 ENCOUNTER — Inpatient Hospital Stay (HOSPITAL_COMMUNITY)
Admission: EM | Admit: 2017-03-16 | Discharge: 2017-03-18 | DRG: 373 | Disposition: A | Discharge status: Home / Self Care | Payer: Medicare Other | Attending: Family Medicine | Admitting: Family Medicine

## 2017-03-16 ENCOUNTER — Encounter (HOSPITAL_COMMUNITY): Payer: Self-pay | Admitting: Emergency Medicine

## 2017-03-16 DIAGNOSIS — E860 Dehydration: Secondary | ICD-10-CM | POA: Diagnosis not present

## 2017-03-16 DIAGNOSIS — Z7982 Long term (current) use of aspirin: Secondary | ICD-10-CM

## 2017-03-16 DIAGNOSIS — A0472 Enterocolitis due to Clostridium difficile, not specified as recurrent: Secondary | ICD-10-CM | POA: Diagnosis not present

## 2017-03-16 DIAGNOSIS — Z880 Allergy status to penicillin: Secondary | ICD-10-CM | POA: Diagnosis not present

## 2017-03-16 DIAGNOSIS — E785 Hyperlipidemia, unspecified: Secondary | ICD-10-CM | POA: Diagnosis present

## 2017-03-16 DIAGNOSIS — R740 Nonspecific elevation of levels of transaminase and lactic acid dehydrogenase [LDH]: Secondary | ICD-10-CM | POA: Diagnosis not present

## 2017-03-16 DIAGNOSIS — E876 Hypokalemia: Secondary | ICD-10-CM

## 2017-03-16 DIAGNOSIS — R7401 Elevation of levels of liver transaminase levels: Secondary | ICD-10-CM

## 2017-03-16 DIAGNOSIS — A0471 Enterocolitis due to Clostridium difficile, recurrent: Secondary | ICD-10-CM | POA: Diagnosis not present

## 2017-03-16 DIAGNOSIS — Z8711 Personal history of peptic ulcer disease: Secondary | ICD-10-CM

## 2017-03-16 DIAGNOSIS — Z90710 Acquired absence of both cervix and uterus: Secondary | ICD-10-CM

## 2017-03-16 DIAGNOSIS — Z79899 Other long term (current) drug therapy: Secondary | ICD-10-CM

## 2017-03-16 DIAGNOSIS — I10 Essential (primary) hypertension: Secondary | ICD-10-CM

## 2017-03-16 DIAGNOSIS — Z8249 Family history of ischemic heart disease and other diseases of the circulatory system: Secondary | ICD-10-CM

## 2017-03-16 DIAGNOSIS — R197 Diarrhea, unspecified: Secondary | ICD-10-CM | POA: Diagnosis not present

## 2017-03-16 LAB — CBC
HCT: 30.5 % — ABNORMAL LOW (ref 36.0–46.0)
Hemoglobin: 10 g/dL — ABNORMAL LOW (ref 12.0–15.0)
MCH: 27.3 pg (ref 26.0–34.0)
MCHC: 32.8 g/dL (ref 30.0–36.0)
MCV: 83.3 fL (ref 78.0–100.0)
Platelets: 379 10*3/uL (ref 150–400)
RBC: 3.66 MIL/uL — ABNORMAL LOW (ref 3.87–5.11)
RDW: 14.6 % (ref 11.5–15.5)
WBC: 15.8 10*3/uL — ABNORMAL HIGH (ref 4.0–10.5)

## 2017-03-16 LAB — COMPREHENSIVE METABOLIC PANEL
ALT: 177 U/L — ABNORMAL HIGH (ref 14–54)
AST: 101 U/L — ABNORMAL HIGH (ref 15–41)
Albumin: 2.4 g/dL — ABNORMAL LOW (ref 3.5–5.0)
Alkaline Phosphatase: 142 U/L — ABNORMAL HIGH (ref 38–126)
Anion gap: 7 (ref 5–15)
BUN: <5 mg/dL — ABNORMAL LOW (ref 6–20)
CO2: 29 mmol/L (ref 22–32)
Calcium: 8.4 mg/dL — ABNORMAL LOW (ref 8.9–10.3)
Chloride: 97 mmol/L — ABNORMAL LOW (ref 101–111)
Creatinine, Ser: 0.64 mg/dL (ref 0.44–1.00)
GFR calc Af Amer: >60 mL/min (ref >60)
GFR calc non Af Amer: >60 mL/min (ref >60)
Glucose, Bld: 101 mg/dL — ABNORMAL HIGH (ref 65–99)
Potassium: 3.2 mmol/L — ABNORMAL LOW (ref 3.5–5.1)
Sodium: 133 mmol/L — ABNORMAL LOW (ref 135–145)
Total Bilirubin: 0.6 mg/dL (ref 0.3–1.2)
Total Protein: 6.6 g/dL (ref 6.5–8.1)

## 2017-03-16 LAB — CBC WITH DIFFERENTIAL/PLATELET
Band Neutrophils: 23 %
Basophils Absolute: 0 10*3/uL (ref 0.0–0.1)
Basophils Relative: 0 %
Blasts: 0 %
Eosinophils Absolute: 0 10*3/uL (ref 0.0–0.7)
Eosinophils Relative: 0 %
HCT: 29.1 % — ABNORMAL LOW (ref 36.0–46.0)
Hemoglobin: 9.9 g/dL — ABNORMAL LOW (ref 12.0–15.0)
Lymphocytes Relative: 20 %
Lymphs Abs: 3.3 10*3/uL (ref 0.7–4.0)
MCH: 28 pg (ref 26.0–34.0)
MCHC: 34 g/dL (ref 30.0–36.0)
MCV: 82.4 fL (ref 78.0–100.0)
Metamyelocytes Relative: 0 %
Monocytes Absolute: 1 10*3/uL (ref 0.1–1.0)
Monocytes Relative: 6 %
Myelocytes: 0 %
Neutro Abs: 12.3 10*3/uL — ABNORMAL HIGH (ref 1.7–7.7)
Neutrophils Relative %: 51 %
Other: 0 %
Platelets: 360 10*3/uL (ref 150–400)
Promyelocytes Absolute: 0 %
RBC: 3.53 MIL/uL — ABNORMAL LOW (ref 3.87–5.11)
RDW: 15.2 % (ref 11.5–15.5)
WBC Morphology: INCREASED
WBC: 16.6 10*3/uL — ABNORMAL HIGH (ref 4.0–10.5)
nRBC: 0 /100 WBC

## 2017-03-16 LAB — I-STAT CHEM 8, ED
BUN: <3 mg/dL — ABNORMAL LOW (ref 6–20)
Calcium, Ion: 1.13 mmol/L — ABNORMAL LOW (ref 1.15–1.40)
Chloride: 94 mmol/L — ABNORMAL LOW (ref 101–111)
Creatinine, Ser: 0.6 mg/dL (ref 0.44–1.00)
Glucose, Bld: 102 mg/dL — ABNORMAL HIGH (ref 65–99)
HCT: 31 % — ABNORMAL LOW (ref 36.0–46.0)
Hemoglobin: 10.5 g/dL — ABNORMAL LOW (ref 12.0–15.0)
Potassium: 3.2 mmol/L — ABNORMAL LOW (ref 3.5–5.1)
Sodium: 136 mmol/L (ref 135–145)
TCO2: 29 mmol/L (ref 0–100)

## 2017-03-16 LAB — LIPASE, BLOOD: Lipase: 39 U/L (ref 11–51)

## 2017-03-16 LAB — CREATININE, SERUM
Creatinine, Ser: 0.64 mg/dL (ref 0.44–1.00)
GFR calc Af Amer: >60 mL/min (ref >60)
GFR calc non Af Amer: >60 mL/min (ref >60)

## 2017-03-16 MED ORDER — BOOST / RESOURCE BREEZE PO LIQD
1.0000 | Freq: Three times a day (TID) | ORAL | Status: DC
  Administered 2017-03-16 – 2017-03-18 (×4): 1 via ORAL

## 2017-03-16 MED ORDER — ACETAMINOPHEN 650 MG RE SUPP: 650.0000 mg | Freq: Four times a day (QID) | RECTAL | Status: DC | PRN

## 2017-03-16 MED ORDER — OXYCODONE HCL 5 MG PO TABS: 5.0000 mg | ORAL_TABLET | ORAL | Status: DC | PRN

## 2017-03-16 MED ORDER — ACETAMINOPHEN 325 MG PO TABS: 650.0000 mg | ORAL_TABLET | Freq: Four times a day (QID) | ORAL | Status: DC | PRN

## 2017-03-16 MED ORDER — ENOXAPARIN SODIUM 40 MG/0.4ML ~~LOC~~ SOLN
40.0000 mg | SUBCUTANEOUS | Status: DC
  Administered 2017-03-16 – 2017-03-17 (×2): 40 mg via SUBCUTANEOUS
  Filled 2017-03-16 (×2): qty 0.4

## 2017-03-16 MED ORDER — VANCOMYCIN 50 MG/ML ORAL SOLUTION
125.0000 mg | Freq: Four times a day (QID) | ORAL | Status: DC
  Administered 2017-03-16 – 2017-03-18 (×8): 125 mg via ORAL
  Filled 2017-03-16 (×11): qty 2.5

## 2017-03-16 MED ORDER — SODIUM CHLORIDE 0.9% FLUSH
3.0000 mL | Freq: Two times a day (BID) | INTRAVENOUS | Status: DC
  Administered 2017-03-16 – 2017-03-18 (×3): 3 mL via INTRAVENOUS

## 2017-03-16 MED ORDER — SODIUM CHLORIDE 0.9 % IV BOLUS (SEPSIS)
1000.0000 mL | Freq: Once | INTRAVENOUS | Status: AC
  Administered 2017-03-16: 1000 mL via INTRAVENOUS

## 2017-03-16 MED ORDER — ONDANSETRON HCL 4 MG/2ML IJ SOLN: 4.0000 mg | Freq: Four times a day (QID) | INTRAMUSCULAR | Status: DC | PRN

## 2017-03-16 MED ORDER — SODIUM CHLORIDE 0.9 % IV SOLN
INTRAVENOUS | Status: AC
  Administered 2017-03-17: 01:00:00 via INTRAVENOUS

## 2017-03-16 MED ORDER — ONDANSETRON HCL 4 MG PO TABS: 4.0000 mg | ORAL_TABLET | Freq: Four times a day (QID) | ORAL | Status: DC | PRN

## 2017-03-16 MED ORDER — POTASSIUM CHLORIDE CRYS ER 20 MEQ PO TBCR
20.0000 meq | EXTENDED_RELEASE_TABLET | Freq: Two times a day (BID) | ORAL | Status: DC
  Administered 2017-03-16 – 2017-03-17 (×4): 20 meq via ORAL
  Filled 2017-03-16 (×4): qty 1

## 2017-03-16 NOTE — H&P (Signed)
History and Physical    [name] AST:[id number] DOB: [birth year] DOA: 03/16/2017  PCP: Azell Der, MD Patient coming from: home  Chief Complaint: Diarrhea,   HPI: [name] is a 66 y.o. female with medical history significant of C diff colitis and Hypertension.  Pt admitted to the hospital on 7/12 and diagnosed with cdiff.  Pt had improvement after starting treatment with oral vancomycin. Pt reports she was switched to metronidazole at home and she has had worsening of symptoms.  Pt reports now passing mucous and malodorous stool.  Pt reports multiple episodes of diarrhea.  Pt complains of increasing weakness.     ED Course: Pt seen in ED.  Pt has increased WBC count. Pt placed on cdiff precautions and IV fluids.   Review of Systems: As per HPI otherwise all other systems reviewed and are negative   Ambulatory Status: ambulatory  Past Medical History:  Diagnosis Date  . History of stomach ulcers 1970s   "in college"  . Hyperlipidemia   . Hypertension   . Obesity (BMI 30.0-34.9)     Past Surgical History:  Procedure Laterality Date  . ABDOMINAL HYSTERECTOMY  2017   "partial; in [place]"  . BLADDER SUSPENSION  2017   "in [place]"  . COLONOSCOPY  2006  . COMBINED HYSTEROSCOPY DIAGNOSTIC / D&C  01/2005   Archie Endo 01/12/2011  . LAPAROSCOPY  1970s   /notes 01/12/2011    Social History   Social History  . Marital status: Single    Spouse name: N/A  . Number of children: N/A  . Years of education: N/A   Occupational History  . Not on file.   Social History Main Topics  . Smoking status: Never Smoker  . Smokeless tobacco: Never Used  . Alcohol use No  . Drug use: No  . Sexual activity: No   Other Topics Concern  . Not on file   Social History Narrative  . No narrative on file    Allergies  Allergen Reactions  . Penicillins Other (See Comments)    Tightness in throat. MD told pt to use with precaution.   Has patient had a PCN reaction causing  immediate rash, facial/tongue/throat swelling, SOB or lightheadedness with hypotension: Yes Has patient had a PCN reaction causing severe rash involving mucus membranes or skin necrosis: Yes Has patient had a PCN reaction that required hospitalization: No Has patient had a PCN reaction occurring within the last 10 years: Yes If all of the above answers are "NO", then may proceed with Cephalosporin use.    Family History  Problem Relation Age of Onset  . Hypertension Mother   . Hypertension Sister   . Hypertension Maternal Aunt   . Heart disease Maternal Aunt   . Hypertension Maternal Uncle     Prior to Admission medications   Medication Sig Start Date End Date Taking? Authorizing Provider  aspirin 81 MG tablet Take 81 mg by mouth daily.   Yes [provider]  cholecalciferol (VITAMIN D) 1000 units tablet Take 1,000 Units by mouth daily.   Yes [provider]  feeding supplement, ENSURE ENLIVE, (ENSURE ENLIVE) LIQD Take 237 mLs by mouth 3 (three) times daily between meals. 03/13/17  Yes Debbe Odea, MD  hydrochlorothiazide (HYDRODIURIL) 25 MG tablet Take 25 mg by mouth daily.   Yes [provider]  hydrocortisone (ANUSOL-HC) 2.5 % rectal cream Place 1 application rectally 4 (four) times daily. 03/13/17  Yes Debbe Odea, MD  lisinopril (PRINIVIL,ZESTRIL) 30 MG tablet  Take 30 mg by mouth daily. 11/29/16  Yes [provider]  metroNIDAZOLE (FLAGYL) 500 MG tablet Take 1 tablet (500 mg total) by mouth 3 (three) times daily. 03/15/17  Yes Horton, Barbette Hair, MD  Multiple Vitamin (MULTIVITAMIN) capsule Take 1 capsule by mouth daily.   Yes [provider]  dicyclomine (BENTYL) 10 MG capsule Take 1 capsule (10 mg total) by mouth 3 (three) times daily as needed (abdominla cramps). Patient not taking: Reported on 03/16/2017 03/13/17   Debbe Odea, MD  ondansetron (ZOFRAN) 4 MG tablet Take 1 tablet (4 mg total) by mouth every 6 (six) hours as needed for  nausea. Patient not taking: Reported on 03/16/2017 03/13/17   Debbe Odea, MD  vancomycin (VANCOCIN) 250 MG capsule Take 2 capsules (500 mg total) by mouth 4 (four) times daily. 500 mg 4 x day for 4 days Then 250 mg 4 X day for 7 days Patient not taking: Reported on 03/16/2017 03/13/17   Debbe Odea, MD    Physical Exam: Vitals:   03/16/17 1249 03/16/17 1300 03/16/17 1330 03/16/17 1400  BP:  (!) 148/86    Pulse: 98 99 95 93  Resp:      Temp:      TempSrc:      SpO2: 99% 100% 99% 100%  Weight:      Height:         General:  Appears calm and comfortable Eyes:  PERRL, EOMI, normal lids, iris ENT:  grossly normal hearing, lips & tongue, mmm Neck:  no LAD, masses or thyromegaly Cardiovascular:  RRR, no m/r/g. No LE edema.  Respiratory:  CTA bilaterally, no w/r/r. Normal respiratory effort. Abdomen:  Soft,diffusely tender , NABS Skin:  no rash or induration seen on limited exam Musculoskeletal:  grossly normal tone BUE/BLE, good ROM, no bony abnormality Psychiatric:  grossly normal mood and affect, speech fluent and appropriate, AOx3 Neurologic:  CN 2-12 grossly intact, moves all extremities in coordinated fashion, sensation intact  Labs on Admission: I have personally reviewed following labs and imaging studies  CBC:  Recent Labs Lab 03/09/17 2242 03/10/17 0243 03/11/17 0358 03/12/17 0909 03/13/17 0530 03/14/17 1627 03/16/17 1040 03/16/17 1103  WBC 47.7* 45.4* 27.1* 11.8* 8.6 10.6* 16.6*  --   NEUTROABS 44.4* 42.2*  --   --   --   --  12.3*  --   HGB 12.1 11.6* 10.1* 10.7* 10.2* 10.6* 9.9* 10.5*  HCT 35.4* 35.6* 31.5* 32.4* 31.0* 32.8* 29.1* 31.0*  MCV 84.1 85.2 84.5 83.7 83.6 83.7 82.4  --   PLT 375 367 330 325 293 316 360  --    Basic Metabolic Panel:  Recent Labs Lab 03/10/17 0757 03/11/17 0358 03/12/17 0909 03/13/17 0530 03/14/17 1627 03/16/17 1040 03/16/17 1103  NA 136 139 136 136 136 133* 136  K 3.7 4.2 3.6 3.8 3.8 3.2* 3.2*  CL 103 111 105 103 100*  97* 94*  CO2 23 20* 25 28 28 29   --   GLUCOSE 129* 119* 127* 102* 107* 101* 102*  BUN 12 14 8 7 7  <5* <3*  CREATININE 0.90 0.71 0.56 0.53 0.72 0.64 0.60  CALCIUM 7.3* 7.8* 7.9* 8.0* 8.4* 8.4*  --   MG 2.1  --   --   --   --   --   --    GFR: Estimated Creatinine Clearance: 66 mL/min (by C-G formula based on SCr of 0.6 mg/dL). Liver Function Tests:  Recent Labs Lab 03/09/17 2242 03/14/17 1627 03/16/17  1040  AST 53* 454* 101*  ALT 30 306* 177*  ALKPHOS 125 195* 142*  BILITOT 0.6 0.4 0.6  PROT 7.9 6.6 6.6  ALBUMIN 2.5* 2.3* 2.4*    Recent Labs Lab 03/09/17 2242 03/14/17 1627 03/16/17 1040  LIPASE 16 52* 39   No results for input(s): AMMONIA in the last 168 hours. Coagulation Profile: No results for input(s): INR, PROTIME in the last 168 hours. Cardiac Enzymes: No results for input(s): CKTOTAL, CKMB, CKMBINDEX, TROPONINI in the last 168 hours. BNP (last 3 results) No results for input(s): PROBNP in the last 8760 hours. HbA1C: No results for input(s): HGBA1C in the last 72 hours. CBG: No results for input(s): GLUCAP in the last 168 hours. Lipid Profile: No results for input(s): CHOL, HDL, LDLCALC, TRIG, CHOLHDL, LDLDIRECT in the last 72 hours. Thyroid Function Tests: No results for input(s): TSH, T4TOTAL, FREET4, T3FREE, THYROIDAB in the last 72 hours. Anemia Panel: No results for input(s): VITAMINB12, FOLATE, FERRITIN, TIBC, IRON, RETICCTPCT in the last 72 hours. Urine analysis:    Component Value Date/Time   COLORURINE AMBER (A) 03/10/2017 1623   APPEARANCEUR HAZY (A) 03/10/2017 1623   LABSPEC 1.042 (H) 03/10/2017 1623   PHURINE 6.0 03/10/2017 1623   GLUCOSEU 50 (A) 03/10/2017 1623   HGBUR SMALL (A) 03/10/2017 1623   BILIRUBINUR NEGATIVE 03/10/2017 1623   KETONESUR 5 (A) 03/10/2017 1623   PROTEINUR 100 (A) 03/10/2017 1623   UROBILINOGEN 1.0 06/26/2012 1201   NITRITE NEGATIVE 03/10/2017 1623   LEUKOCYTESUR NEGATIVE 03/10/2017 1623    Creatinine  Clearance: Estimated Creatinine Clearance: 66 mL/min (by C-G formula based on SCr of 0.6 mg/dL).  Sepsis Labs: @LABRCNTIP (procalcitonin:4,lacticidven:4) ) Recent Results (from the past 240 hour(s))  C difficile quick screen w PCR reflex     Status: Abnormal   Collection Time: 03/10/17  2:09 AM  Result Value Ref Range Status   C Diff antigen POSITIVE (A) NEGATIVE Final   C Diff toxin POSITIVE (A) NEGATIVE Final   C Diff interpretation Toxin producing C. difficile detected.  Final    Comment: CRITICAL RESULT CALLED TO, READ BACK BY AND VERIFIED WITH: M.BROWN,RN AT 0324 03/10/17,BY L.PITT      Radiological Exams on Admission: Dg Abdomen 1 View  Result Date: 03/15/2017 CLINICAL DATA:  Left upper posterior back pain with leg swelling. EXAM: ABDOMEN - 1 VIEW COMPARISON:  06/21/2012 lumbar spine, CT abdomen and pelvis 03/10/2017. FINDINGS: Mild segmental gaseous distention of small bowel loops centrally within the abdomen without definite bowel obstruction. Findings may reflect a sympathetic small bowel ileus secondary to pancolitis noted on recent CT. No free air noted. No organomegaly. No suspicious calculi. There is mid to lower lumbar degenerative disc and facet arthropathy. The bony pelvis and hips are intact. IMPRESSION: 1. Mid to lower lumbar disc and facet arthropathy. 2. Mild gaseous distention small bowel loops in the mid abdomen. Findings may reflect a sympathetic ileus. Electronically Signed   By: Ashley Royalty M.D.   On: 03/15/2017 00:57    EKG: Independently reviewed.   Assessment/Plan  C,difficile colitis  Pt restarted on oral vancomycin  IV fluids for dehydration   Hypokalemia  Pt given oral potassium  Bmet in am.   Hypertension Continue current medications        DVT prophylaxis: lovenox Code Status: full Family Communication: none Disposition Plan: home  Consults called: none  Admission status: Admission   Alyse Low PA-C [facility] [phone] If 7PM-7AM, please contact night-coverage [email or web address] Password [email or web address]  03/16/2017, 2:10 PM

## 2017-03-16 NOTE — ED Provider Notes (Signed)
Medical screening examination/treatment/procedure(s) were conducted as a shared visit with non-physician practitioner(s) and myself.  I personally evaluated the patient during the encounter.  Dehydration and significant amounts of diarrhea secondary to Cdiff. Started flagyl PO a couple days ago after being off of antibiotic a couple days secondary to financial concerns. On exam her abdomen is non-peritonitic without any evidence of a surgical abdomen however patient does appear dehydrated. Plan will be for admission for failed outpatient therapy for C. difficile colitis.     Merrily Pew, MD 03/21/17 1013

## 2017-03-16 NOTE — ED Provider Notes (Signed)
[facility] DEPT Provider Note   CSN: [id number] Arrival date & time: 03/16/17  0935     History   Chief Complaint Chief Complaint  Patient presents with  . Diarrhea    HPI [name] is a 66 y.o. female who presents emergency Department with chief complaint of worsening diarrhea. The patient was discharged from the hospital on 03/13/2017 after admission with severe difficulty to his. She states that her white count were dropping and her diarrhea was improving. Review of the medical record shows that the patient did come back to the ER on 7/16 because she was unable to afford oral vancomycin. She was able to transition to oral Flagyl. Of note, she has bilateral lower extremity swelling which is new since her discharge. The patient has been compliant with her oral Flagyl but states that she feels much worse that her pain, foul odor, voluminous stools have returned and she is feeling weak when she stands. She is also using the bathroom every 15-20 minutes. At discharge, she had gone every 45 minutes to an hour. She feels that her abdomen is more distended and tender. She denies using any antidiarrheals outside of the ER. She had imaging on 7/16/2018but no evidence of toxic megacolon She states that she feels she is failing outpatient treatment. The patient denies fever, chest pain, shortness of breath.  HPI  Past Medical History:  Diagnosis Date  . History of stomach ulcers 1970s   "in college"  . Hyperlipidemia   . Hypertension   . Obesity (BMI 30.0-34.9)     Patient Active Problem List   Diagnosis Date Noted  . Left adrenal mass ([place]) 03/13/2017  . Severe sepsis ([place]) 03/13/2017  . Clostridium difficile colitis 03/10/2017  . AKI (acute kidney injury) ([place])   . Dehydration   . Hypokalemia   . Essential hypertension 08/19/2006    Past Surgical History:  Procedure Laterality Date  . ABDOMINAL HYSTERECTOMY  2017   "partial; in [place]"  . BLADDER SUSPENSION  2017   "in [place]"  . COLONOSCOPY  2006  . COMBINED HYSTEROSCOPY DIAGNOSTIC / D&C  01/2005   Archie Endo 01/12/2011  . LAPAROSCOPY  1970s   Archie Endo 01/12/2011    OB History    No data available       Home Medications    Prior to Admission medications   Medication Sig Start Date End Date Taking? Authorizing Provider  aspirin 81 MG tablet Take 81 mg by mouth daily.    [provider]  dicyclomine (BENTYL) 10 MG capsule Take 1 capsule (10 mg total) by mouth 3 (three) times daily as needed (abdominla cramps). 03/13/17   Debbe Odea, MD  feeding supplement, ENSURE ENLIVE, (ENSURE ENLIVE) LIQD Take 237 mLs by mouth 3 (three) times daily between meals. 03/13/17   Debbe Odea, MD  hydrochlorothiazide (HYDRODIURIL) 25 MG tablet Take 25 mg by mouth daily.    [provider]  hydrocortisone (ANUSOL-HC) 2.5 % rectal cream Place 1 application rectally 4 (four) times daily. 03/13/17   Debbe Odea, MD  lisinopril (PRINIVIL,ZESTRIL) 30 MG tablet Take 30 mg by mouth daily. 11/29/16   [provider]  metroNIDAZOLE (FLAGYL) 500 MG tablet Take 1 tablet (500 mg total) by mouth 3 (three) times daily. 03/15/17   Horton, Barbette Hair, MD  Multiple Vitamin (MULTIVITAMIN) capsule Take 1 capsule by mouth daily.    [provider]  ondansetron (ZOFRAN) 4 MG tablet Take 1 tablet (4 mg total) by mouth every 6 (six) hours as  needed for nausea. 03/13/17   Debbe Odea, MD  vancomycin (VANCOCIN) 250 MG capsule Take 2 capsules (500 mg total) by mouth 4 (four) times daily. 500 mg 4 x day for 4 days Then 250 mg 4 X day for 7 days 03/13/17   Debbe Odea, MD    Family History Family History  Problem Relation Age of Onset  . Hypertension Mother   . Hypertension Sister   . Hypertension Maternal Aunt   . Heart disease Maternal Aunt   . Hypertension Maternal Uncle     Social History Social History  Substance Use Topics  . Smoking status: Never Smoker  . Smokeless tobacco: Never Used  .  Alcohol use No     Allergies   Penicillins   Review of Systems Review of Systems  Ten systems reviewed and are negative for acute change, except as noted in the HPI.   Physical Exam Updated Vital Signs BP (!) 149/92 (BP Location: Right Arm)   Pulse (!) 104   Temp 97.9 F (36.6 C) (Oral)   Resp 18   Ht 5\' 3"  (1.6 m)   Wt 70.3 kg (155 lb)   SpO2 100%   BMI 27.46 kg/m   Physical Exam  Constitutional: She is oriented to person, place, and time. She appears well-developed and well-nourished. No distress.  HENT:  Head: Normocephalic and atraumatic.  Dry oral mucosa  Eyes: Conjunctivae are normal. No scleral icterus.  Neck: Normal range of motion.  Cardiovascular: Normal rate, regular rhythm and normal heart sounds.  Exam reveals no gallop and no friction rub.   No murmur heard. Pulmonary/Chest: Effort normal and breath sounds normal. No respiratory distress.  Abdominal: Soft. Bowel sounds are normal. She exhibits no distension and no mass. There is tenderness. There is no guarding.  Neurological: She is alert and oriented to person, place, and time.  Skin: Skin is warm and dry. She is not diaphoretic.  Psychiatric: Her behavior is normal.  Nursing note and vitals reviewed.    ED Treatments / Results  Labs (all labs ordered are listed, but only abnormal results are displayed) Labs Reviewed - No data to display  EKG  EKG Interpretation None       Radiology Dg Abdomen 1 View  Result Date: 03/15/2017 CLINICAL DATA:  Left upper posterior back pain with leg swelling. EXAM: ABDOMEN - 1 VIEW COMPARISON:  06/21/2012 lumbar spine, CT abdomen and pelvis 03/10/2017. FINDINGS: Mild segmental gaseous distention of small bowel loops centrally within the abdomen without definite bowel obstruction. Findings may reflect a sympathetic small bowel ileus secondary to pancolitis noted on recent CT. No free air noted. No organomegaly. No suspicious calculi. There is mid to lower lumbar  degenerative disc and facet arthropathy. The bony pelvis and hips are intact. IMPRESSION: 1. Mid to lower lumbar disc and facet arthropathy. 2. Mild gaseous distention small bowel loops in the mid abdomen. Findings may reflect a sympathetic ileus. Electronically Signed   By: Ashley Royalty M.D.   On: 03/15/2017 00:57    Procedures Procedures (including critical care time)  Medications Ordered in ED Medications - No data to display   Initial Impression / Assessment and Plan / ED Course  I have reviewed the triage vital signs and the nursing notes.  Pertinent labs & imaging results that were available during my care of the patient were reviewed by me and considered in my medical decision making (see chart for details).  Clinical Course as of Mar 17 1323  Wed Mar 16, 2017  1209 WBC: (!) 16.6 [AH]  1209 NEUT#: (!) 12.3 [AH]  1210 Sodium: (!) 133 [AH]  1210 Potassium: (!) 3.2 [AH]  1210 Albumin: (!) 2.4 [AH]  1210 AST: (!) 101 [AH]  1210 ALT: (!) 177 [AH]  1210 The patient. White blood cell count is steadily increasing since her discharge. She is a mild low potassium at 3.2. Alkaline Phosphatase: (!) 142 [AH]  1210 Her hemoglobin has decreased to nearly 1 g. Her transaminitis appears to be improving at this time. The patient appears to be failing outpatient treatment of her C. difficile. I feel she would need readmission and stabilization. Hemoglobin: (!) 9.9 [AH]    Clinical Course User Index [AH] Margarita Mail, PA-C     Final Clinical Impressions(s) / ED Diagnoses   Final diagnoses:  C. difficile colitis  Transaminitis    New Prescriptions New Prescriptions   No medications on file     Margarita Mail, PA-C 03/16/17 1412    Mesner, Corene Cornea, MD 03/16/17 1504

## 2017-03-16 NOTE — Assessment & Plan Note (Signed)
Oral vancomycin and Iv fluids

## 2017-03-16 NOTE — ED Triage Notes (Signed)
Pt reports discharged from [facility] dx C Diff on Sunday. Pt states, "I can't manage this at home." Pt denies LOC, CP, dizziness, n/v.  Pt denies fecal incontinence, reports frequent stools.

## 2017-03-16 NOTE — ED Notes (Signed)
Admitting PA at bedside.

## 2017-03-17 DIAGNOSIS — A0472 Enterocolitis due to Clostridium difficile, not specified as recurrent: Secondary | ICD-10-CM

## 2017-03-17 LAB — CBC
HCT: 28.4 % — ABNORMAL LOW (ref 36.0–46.0)
Hemoglobin: 9.2 g/dL — ABNORMAL LOW (ref 12.0–15.0)
MCH: 26.9 pg (ref 26.0–34.0)
MCHC: 32.4 g/dL (ref 30.0–36.0)
MCV: 83 fL (ref 78.0–100.0)
Platelets: 373 10*3/uL (ref 150–400)
RBC: 3.42 MIL/uL — ABNORMAL LOW (ref 3.87–5.11)
RDW: 14.9 % (ref 11.5–15.5)
WBC: 14.2 10*3/uL — ABNORMAL HIGH (ref 4.0–10.5)

## 2017-03-17 LAB — CBC WITH DIFFERENTIAL/PLATELET
Basophils Absolute: 0 10*3/uL (ref 0.0–0.1)
Basophils Relative: 0 %
Eosinophils Absolute: 0 10*3/uL (ref 0.0–0.7)
Eosinophils Relative: 0 %
HCT: 30.9 % — ABNORMAL LOW (ref 36.0–46.0)
Hemoglobin: 9.9 g/dL — ABNORMAL LOW (ref 12.0–15.0)
Lymphocytes Relative: 16 %
Lymphs Abs: 2.3 10*3/uL (ref 0.7–4.0)
MCH: 26.8 pg (ref 26.0–34.0)
MCHC: 32 g/dL (ref 30.0–36.0)
MCV: 83.7 fL (ref 78.0–100.0)
Monocytes Absolute: 0.8 10*3/uL (ref 0.1–1.0)
Monocytes Relative: 6 %
Neutro Abs: 10.9 10*3/uL — ABNORMAL HIGH (ref 1.7–7.7)
Neutrophils Relative %: 78 %
Platelets: 422 10*3/uL — ABNORMAL HIGH (ref 150–400)
RBC: 3.69 MIL/uL — ABNORMAL LOW (ref 3.87–5.11)
RDW: 15 % (ref 11.5–15.5)
WBC: 14 10*3/uL — ABNORMAL HIGH (ref 4.0–10.5)

## 2017-03-17 LAB — BASIC METABOLIC PANEL
Anion gap: 8 (ref 5–15)
BUN: <5 mg/dL — ABNORMAL LOW (ref 6–20)
CO2: 25 mmol/L (ref 22–32)
Calcium: 7.7 mg/dL — ABNORMAL LOW (ref 8.9–10.3)
Chloride: 105 mmol/L (ref 101–111)
Creatinine, Ser: 0.62 mg/dL (ref 0.44–1.00)
GFR calc Af Amer: >60 mL/min (ref >60)
GFR calc non Af Amer: >60 mL/min (ref >60)
Glucose, Bld: 76 mg/dL (ref 65–99)
Potassium: 3.9 mmol/L (ref 3.5–5.1)
Sodium: 138 mmol/L (ref 135–145)

## 2017-03-17 NOTE — Care Management Note (Addendum)
Case Management Note  Patient Details  Name: [name] MRN: [id number] Date of Birth: [birth year]  Subjective/Objective:     C-diff, HTN, dehydration               Action/Plan: Discharge Planning: NCM spoke to pt and states she does not have drug coverage. Her drug coverage will start March 30, 2017. States she had to come back to hospital because she was not able to afford medication. Will use [facility] with override for Vancomycin. Explained to pt to pick up at the [facility], they do have in stock. She will have a $3 copay and can utilized once per year without drug coverage. Scheduled appt with [place] on 03/21/17 at 10 am.    Expected Discharge Date:                 Expected Discharge Plan:  Home/Self Care  In-House Referral:  NA  Discharge planning Services  [facility], Medication Assistance, Follow-up appt scheduled  Post Acute Care Choice:  NA Choice offered to:  NA  DME Arranged:  N/A DME Agency:  NA  HH Arranged:  NA HH Agency:  NA  Status of Service:  Completed, signed off  If discussed at [place] of Stay Meetings, dates discussed:    Additional Comments:  Erenest Rasher, RN 03/17/2017, 12:40 PM

## 2017-03-17 NOTE — Progress Notes (Signed)
Pt called RN to assess stool for blood. Upon entering the bathroom, RN noted bright red blood in toilet bowl. No complaints of abdominal pain associated with the stool. Placed hat in toilet to collect sample if needed. Schorr, NP made aware and awaiting further orders. Will continue to monitor.

## 2017-03-17 NOTE — Progress Notes (Signed)
PROGRESS NOTE    [name]  KKX:[id number] DOB: 1951-07-02 DOA: 03/16/2017 PCP: Azell Der, MD    Brief Narrative:   66 y.o. female who presents with recurrent C diff after failed outpt mgt w/ resulting dehydration. Patient failed Flagyl therapy as an outpatient. His presenting to the hospital for further treatment and is improving on oral vancomycin. Case manager consulted.   Assessment & Plan:    Recurrent Clostridium difficile diarrhea - Patient failed outpatient Flagyl therapy. Is currently improving on oral vancomycin therapy. Will plan on 14 day total treatment course at this juncture.   Active Problems:   Essential hypertension -Currently well controlled off of antihypertensives with last rate 123/74    Dehydration -Improving. Most mucous membranes on exam    Hypokalemia - Resolved and most likely secondary to diarrhea   DVT prophylaxis: Lovenox Code Status: Full Family Communication: Tried to call daughter on emergency contact list 2 times but recheck voicemail Disposition Plan: Pending continued improvement in condition. With cessation of diarrhea, we'll most likely consider DC next a.m.   Consultants:   None   Procedures: None   Antimicrobials: Oral vancomycin   Subjective: The patient has no new complaints. States she feels better. Was having some leakage until 8 AM.  Objective: Vitals:   03/16/17 1600 03/16/17 1713 03/16/17 2215 03/17/17 0517  BP:  129/72 130/77 123/74  Pulse: 90 88 95 89  Resp:  17 17 17   Temp:  98.7 F (37.1 C) 99.7 F (37.6 C) 98.4 F (36.9 C)  TempSrc:  Oral Oral Oral  SpO2: 99% 98% 98% 100%  Weight:      Height:        Intake/Output Summary (Last 24 hours) at 03/17/17 1107 Last data filed at 03/17/17 0843  Gross per 24 hour  Intake             1965 ml  Output                0 ml  Net             1965 ml   Filed Weights   03/16/17 0952  Weight: 70.3 kg (155 lb)    Examination:  General exam: Appears  calm and comfortable , In no acute distress Respiratory system: Clear to auscultation. Respiratory effort normal. No wheezes Cardiovascular system: S1 & S2 heard, RRR. No JVD, murmurs, rubs, gallops or clicks. No pedal edema. Gastrointestinal system: Abdomen is nondistended, soft and nontender. No organomegaly or masses felt.  Central nervous system: Alert and oriented. No focal neurological deficits. Extremities: Symmetric 5 x 5 power. Skin: No rashes, lesions or ulcers, on limited exam Psychiatry:  Mood & affect appropriate.   Data Reviewed: I have personally reviewed following labs and imaging studies  CBC:  Recent Labs Lab 03/14/17 1627 03/16/17 1040 03/16/17 1103 03/16/17 1644 03/17/17 0228 03/17/17 0919  WBC 10.6* 16.6*  --  15.8* 14.2* 14.0*  NEUTROABS  --  12.3*  --   --   --  10.9*  HGB 10.6* 9.9* 10.5* 10.0* 9.2* 9.9*  HCT 32.8* 29.1* 31.0* 30.5* 28.4* 30.9*  MCV 83.7 82.4  --  83.3 83.0 83.7  PLT 316 360  --  379 373 169*   Basic Metabolic Panel:  Recent Labs Lab 03/12/17 0909 03/13/17 0530 03/14/17 1627 03/16/17 1040 03/16/17 1103 03/16/17 1644 03/17/17 0228  NA 136 136 136 133* 136  --  138  K 3.6 3.8 3.8 3.2* 3.2*  --  3.9  CL 105 103 100* 97* 94*  --  105  CO2 25 28 28 29   --   --  25  GLUCOSE 127* 102* 107* 101* 102*  --  76  BUN 8 7 7  <5* <3*  --  <5*  CREATININE 0.56 0.53 0.72 0.64 0.60 0.64 0.62  CALCIUM 7.9* 8.0* 8.4* 8.4*  --   --  7.7*   GFR: Estimated Creatinine Clearance: 66 mL/min (by C-G formula based on SCr of 0.62 mg/dL). Liver Function Tests:  Recent Labs Lab 03/14/17 1627 03/16/17 1040  AST 454* 101*  ALT 306* 177*  ALKPHOS 195* 142*  BILITOT 0.4 0.6  PROT 6.6 6.6  ALBUMIN 2.3* 2.4*    Recent Labs Lab 03/14/17 1627 03/16/17 1040  LIPASE 52* 39   No results for input(s): AMMONIA in the last 168 hours. Coagulation Profile: No results for input(s): INR, PROTIME in the last 168 hours. Cardiac Enzymes: No results for  input(s): CKTOTAL, CKMB, CKMBINDEX, TROPONINI in the last 168 hours. BNP (last 3 results) No results for input(s): PROBNP in the last 8760 hours. HbA1C: No results for input(s): HGBA1C in the last 72 hours. CBG: No results for input(s): GLUCAP in the last 168 hours. Lipid Profile: No results for input(s): CHOL, HDL, LDLCALC, TRIG, CHOLHDL, LDLDIRECT in the last 72 hours. Thyroid Function Tests: No results for input(s): TSH, T4TOTAL, FREET4, T3FREE, THYROIDAB in the last 72 hours. Anemia Panel: No results for input(s): VITAMINB12, FOLATE, FERRITIN, TIBC, IRON, RETICCTPCT in the last 72 hours. Sepsis Labs:  Recent Labs Lab 03/10/17 1825 03/10/17 2223  LATICACIDVEN 2.6* 1.3    Recent Results (from the past 240 hour(s))  C difficile quick screen w PCR reflex     Status: Abnormal   Collection Time: 03/10/17  2:09 AM  Result Value Ref Range Status   C Diff antigen POSITIVE (A) NEGATIVE Final   C Diff toxin POSITIVE (A) NEGATIVE Final   C Diff interpretation Toxin producing C. difficile detected.  Final    Comment: CRITICAL RESULT CALLED TO, READ BACK BY AND VERIFIED WITH: M.BROWN,RN AT 0324 03/10/17,BY L.PITT       Radiology Studies: No results found.   Scheduled Meds: . enoxaparin (LOVENOX) injection  40 mg Subcutaneous Q24H  . feeding supplement  1 Container Oral TID BM  . potassium chloride  20 mEq Oral BID  . sodium chloride flush  3 mL Intravenous Q12H  . vancomycin  125 mg Oral QID   Continuous Infusions: . sodium chloride 125 mL/hr at 03/17/17 0047     LOS: 1 day    Time spent: > 35 minutes    Velvet Bathe, MD [facility] Pager [phone]  If 7PM-7AM, please contact night-coverage [email or web address] Password [email or web address] 03/17/2017, 11:07 AM

## 2017-03-17 NOTE — Progress Notes (Signed)
Initial Nutrition Assessment  DOCUMENTATION CODES:   Not applicable  INTERVENTION:   -Continue Boost Breeze po TID, each supplement provides 250 kcal and 9 grams of protein  -Recommend Soft Diet until diarrhea improves; reviewed with pt, pt familiar and receptive.    NUTRITION DIAGNOSIS:   Inadequate oral intake related to altered GI function as evidenced by  (liquid diet, C.diff diarrhea).  GOAL:   Patient will meet greater than or equal to 90% of their needs  MONITOR:   PO intake, Diet advancement, Supplement acceptance, Labs, Weight trends, I & O's  REASON FOR ASSESSMENT:   Malnutrition Screening Tool    ASSESSMENT:   66 yo female admitted with dehydration with recurrent C.diff colitis. Pt with hx of HTN, C.diff, HL  Tolerating CL diet, recorded po intake 100% at breakfast this AM. Pt reports PTA tolearting Soft diet well with good appetite  No weight loss since last admission; pt reports weight fluctuates up and down but she always wears a size 12-14 pant.  Nutrition-Focused physical exam completed. Findings are WDL for fat depletion, muscle depletion, and edema.   Labs: reviewed Meds: NS at 125 ml/hr, KCl  Diet Order:  Diet clear liquid Room service appropriate? Yes; Fluid consistency: Thin  Skin:  Reviewed, no issues  Last BM:  7/19  Height:   Ht Readings from Last 1 Encounters:  03/16/17 5\' 3"  (1.6 m)    Weight:   Wt Readings from Last 1 Encounters:  03/16/17 155 lb (70.3 kg)    Ideal Body Weight:     BMI:  Body mass index is 27.46 kg/m.  Estimated Nutritional Needs:   Kcal:  1700-1900  Protein:  85-100 grams  Fluid:  >/= 1.7 L  EDUCATION NEEDS:   Education needs addressed  Kerman Passey MS, RD, LDN [phone] Pager  [phone] Weekend/On-Call Pager

## 2017-03-18 LAB — CBC
HCT: 30.2 % — ABNORMAL LOW (ref 36.0–46.0)
Hemoglobin: 9.9 g/dL — ABNORMAL LOW (ref 12.0–15.0)
MCH: 27.9 pg (ref 26.0–34.0)
MCHC: 32.8 g/dL (ref 30.0–36.0)
MCV: 85.1 fL (ref 78.0–100.0)
Platelets: 488 10*3/uL — ABNORMAL HIGH (ref 150–400)
RBC: 3.55 MIL/uL — ABNORMAL LOW (ref 3.87–5.11)
RDW: 15.7 % — ABNORMAL HIGH (ref 11.5–15.5)
WBC: 11.1 10*3/uL — ABNORMAL HIGH (ref 4.0–10.5)

## 2017-03-18 MED ORDER — VANCOMYCIN HCL 125 MG PO CAPS: 125.0000 mg | ORAL_CAPSULE | Freq: Four times a day (QID) | ORAL | 0 refills | Status: AC

## 2017-03-18 MED FILL — VANCOMYCIN HCL 125 MG CAP: 125 | 12 days supply | Qty: 48 | Fill #0

## 2017-03-18 NOTE — Discharge Summary (Signed)
Physician Discharge Summary  [name] DPO:[id number] DOB: [birth year] DOA: 03/16/2017  PCP: No primary care provider on file.  Admit date: 03/16/2017 Discharge date: 03/18/2017  Time spent: > 35 minutes  Recommendations for Outpatient Follow-up:  1. Reassess wbc levels 2. Clinically improved on 125 mg of po vancomycin qid. Will d/c on this with oral caps for 12 days to complete a 14 day continual treatment regimen   Discharge Diagnoses:  Active Problems:   Essential hypertension   Dehydration   Hypokalemia   Recurrent Clostridium difficile diarrhea   C. difficile colitis   Colitis, Clostridium difficile   Discharge Condition: stable  Diet recommendation: heart healthy  Filed Weights   03/16/17 0952  Weight: 70.3 kg (155 lb)    History of present illness:  [name] is a 66 y.o. female who presents with recurrent C diff after failed outpt mgt with flagyl w/ resulting dehydration.   Hospital Course:   Recurrent Clostridium difficile diarrhea - Patient failed outpatient Flagyl therapy. Is currently improving on oral vancomycin therapy. Will plan on 14 day total treatment course as such will d/c on with script for vancomycin capsule for 12 more days. I thing patient should have trial of 14 day treatment course with oral vancomycin as current dose as she did not fail vancomycin therapy she just did not take it due to costs  Active Problems:   Essential hypertension -Currently well controlled off of antihypertensives. Will d/c off antihypertensive medication due to soft blood pressures, suspect patient has softer blood pressures due to fluid loses from diarrhea. Pt to check her blood pressures as outpatient once diarrhea subsides and if elevated then she can discuss with her pcp which medication to continue.    Dehydration - resolved and was secondary to principle problem.    Hypokalemia - Resolved and most likely secondary to  diarrhea  Procedures:  none  Consultations:  none  Discharge Exam: Vitals:   03/18/17 0450 03/18/17 0532  BP: (!) 105/53 120/60  Pulse: 90   Resp: 17   Temp: 98.2 F (36.8 C)     General: Pt in nad, alert and awake Cardiovascular: rrr, no rubs, or gallops Respiratory: CTA BL, no wheezes  Discharge Instructions   Discharge Instructions    Call MD for:  extreme fatigue    Complete by:  As directed    Call MD for:  temperature >100.4    Complete by:  As directed    Diet - low sodium heart healthy    Complete by:  As directed    Discharge instructions    Complete by:  As directed    Please be sure to follow up with your primary care physician in 1-2 weeks or sooner should any new concerns arise.   Increase activity slowly    Complete by:  As directed      Current Discharge Medication List    CONTINUE these medications which have CHANGED   Details  vancomycin (VANCOCIN) 125 MG capsule Take 1 capsule (125 mg total) by mouth 4 (four) times daily. Take as directed Qty: 48 capsule, Refills: 0      CONTINUE these medications which have NOT CHANGED   Details  aspirin 81 MG tablet Take 81 mg by mouth daily.    cholecalciferol (VITAMIN D) 1000 units tablet Take 1,000 Units by mouth daily.    feeding supplement, ENSURE ENLIVE, (ENSURE ENLIVE) LIQD Take 237 mLs by mouth 3 (three) times daily between meals. Qty: 30 Bottle, Refills: 1  Multiple Vitamin (MULTIVITAMIN) capsule Take 1 capsule by mouth daily.      STOP taking these medications     hydrochlorothiazide (HYDRODIURIL) 25 MG tablet      hydrocortisone (ANUSOL-HC) 2.5 % rectal cream      lisinopril (PRINIVIL,ZESTRIL) 30 MG tablet      metroNIDAZOLE (FLAGYL) 500 MG tablet      dicyclomine (BENTYL) 10 MG capsule      ondansetron (ZOFRAN) 4 MG tablet        Allergies  Allergen Reactions  . Penicillins Other (See Comments)    Tightness in throat. MD told pt to use with precaution.   Has patient  had a PCN reaction causing immediate rash, facial/tongue/throat swelling, SOB or lightheadedness with hypotension: Yes Has patient had a PCN reaction causing severe rash involving mucus membranes or skin necrosis: Yes Has patient had a PCN reaction that required hospitalization: No Has patient had a PCN reaction occurring within the last 10 years: Yes If all of the above answers are "NO", then may proceed with Cephalosporin use.   Follow-up Information    [facility]. Call.   Why:  appointment scheduled for March 21, 2017 at 10 am, please call if you cannot make the appointment to reschedule. please bring your copay and identification Contact information: [place] [id number] [place] Follow up.   Why:  take prescription to [facility], building is located behind [facility], turn left into parking lot Contact information: [place] [address] [phone]           The results of significant diagnostics from this hospitalization (including imaging, microbiology, ancillary and laboratory) are listed below for reference.    Significant Diagnostic Studies: Dg Abdomen 1 View  Result Date: 03/15/2017 CLINICAL DATA:  Left upper posterior back pain with leg swelling. EXAM: ABDOMEN - 1 VIEW COMPARISON:  06/21/2012 lumbar spine, CT abdomen and pelvis 03/10/2017. FINDINGS: Mild segmental gaseous distention of small bowel loops centrally within the abdomen without definite bowel obstruction. Findings may reflect a sympathetic small bowel ileus secondary to pancolitis noted on recent CT. No free air noted. No organomegaly. No suspicious calculi. There is mid to lower lumbar degenerative disc and facet arthropathy. The bony pelvis and hips are intact. IMPRESSION: 1. Mid to lower lumbar disc and facet arthropathy. 2. Mild gaseous distention small  bowel loops in the mid abdomen. Findings may reflect a sympathetic ileus. Electronically Signed   By: Ashley Royalty M.D.   On: 03/15/2017 00:57   Ct Abdomen Pelvis W Contrast  Result Date: 03/10/2017 CLINICAL DATA:  66 year old female with 3 weeks of diarrhea. EXAM: CT ABDOMEN AND PELVIS WITH CONTRAST TECHNIQUE: Multidetector CT imaging of the abdomen and pelvis was performed using the standard protocol following bolus administration of intravenous contrast. CONTRAST:  129mL ISOVUE-300 IOPAMIDOL (ISOVUE-300) INJECTION 61% COMPARISON:  None. FINDINGS: Lower chest: The visualized lung bases are clear. No intra-abdominal free air.  Small free fluid within the pelvis. Hepatobiliary: Multiple small hepatic hypodense lesions, incompletely characterized, likely cysts or hemangioma. The liver is otherwise unremarkable. No intrahepatic biliary ductal dilatation. The gallbladder is unremarkable as well. Pancreas: Unremarkable. No pancreatic ductal dilatation or surrounding inflammatory changes. Spleen: Normal in size without focal abnormality. Adrenals/Urinary Tract: There is a 1 cm indeterminate left adrenal hypodense nodule, likely an adenoma. The right adrenal gland appears unremarkable. The kidneys,  visualized ureters, and urinary bladder appear unremarkable. Stomach/Bowel: There is inflammatory changes and thickening of the entire colon and rectosigmoid consistent with pancolitis. Findings may be infectious in etiology such as pseudomembranous colitis or related to underlying inflammatory bowel disease such as ulcerative colitis. The full-thickness involvement however favors an infectious process. Correlation with history of recent antibiotic use and stool cultures recommended. There is no evidence of bowel obstruction. Normal appendix. Vascular/Lymphatic: No significant vascular findings are present. No enlarged abdominal or pelvic lymph nodes. Reproductive: Probable partial hysterectomy. The ovaries appear  unremarkable. No pelvic masses. Other: None Musculoskeletal: Mild degenerative changes of the spine. L4-L5 disc desiccation with vacuum phenomena. No acute fracture. IMPRESSION: Pancolitis most likely of an infectious etiology such as C. diff colitis. Correlation with history of recent antibiotic use and stool cultures recommended. No bowel obstruction. Normal appendix. Electronically Signed   By: Anner Crete M.D.   On: 03/10/2017 04:14    Microbiology: Recent Results (from the past 240 hour(s))  C difficile quick screen w PCR reflex     Status: Abnormal   Collection Time: 03/10/17  2:09 AM  Result Value Ref Range Status   C Diff antigen POSITIVE (A) NEGATIVE Final   C Diff toxin POSITIVE (A) NEGATIVE Final   C Diff interpretation Toxin producing C. difficile detected.  Final    Comment: CRITICAL RESULT CALLED TO, READ BACK BY AND VERIFIED WITH: M.BROWN,RN AT 0324 03/10/17,BY L.PITT      Labs: Basic Metabolic Panel:  Recent Labs Lab 03/12/17 0909 03/13/17 0530 03/14/17 1627 03/16/17 1040 03/16/17 1103 03/16/17 1644 03/17/17 0228  NA 136 136 136 133* 136  --  138  K 3.6 3.8 3.8 3.2* 3.2*  --  3.9  CL 105 103 100* 97* 94*  --  105  CO2 25 28 28 29   --   --  25  GLUCOSE 127* 102* 107* 101* 102*  --  76  BUN 8 7 7  <5* <3*  --  <5*  CREATININE 0.56 0.53 0.72 0.64 0.60 0.64 0.62  CALCIUM 7.9* 8.0* 8.4* 8.4*  --   --  7.7*   Liver Function Tests:  Recent Labs Lab 03/14/17 1627 03/16/17 1040  AST 454* 101*  ALT 306* 177*  ALKPHOS 195* 142*  BILITOT 0.4 0.6  PROT 6.6 6.6  ALBUMIN 2.3* 2.4*    Recent Labs Lab 03/14/17 1627 03/16/17 1040  LIPASE 52* 39   No results for input(s): AMMONIA in the last 168 hours. CBC:  Recent Labs Lab 03/16/17 1040 03/16/17 1103 03/16/17 1644 03/17/17 0228 03/17/17 0919 03/18/17 0425  WBC 16.6*  --  15.8* 14.2* 14.0* 11.1*  NEUTROABS 12.3*  --   --   --  10.9*  --   HGB 9.9* 10.5* 10.0* 9.2* 9.9* 9.9*  HCT 29.1* 31.0* 30.5*  28.4* 30.9* 30.2*  MCV 82.4  --  83.3 83.0 83.7 85.1  PLT 360  --  379 373 422* 488*   Cardiac Enzymes: No results for input(s): CKTOTAL, CKMB, CKMBINDEX, TROPONINI in the last 168 hours. BNP: BNP (last 3 results) No results for input(s): BNP in the last 8760 hours.  ProBNP (last 3 results) No results for input(s): PROBNP in the last 8760 hours.  CBG: No results for input(s): GLUCAP in the last 168 hours.   Signed:  Velvet Bathe MD.  [facility] 03/18/2017, 11:49 AM

## 2017-03-18 NOTE — Progress Notes (Signed)
Patient discharge teaching given, including activity, diet, follow-up appoints, and medications. Patient verbalized understanding of all discharge instructions. IV access was d/c'd. Vitals are stable. Skin is intact except as charted in most recent assessments. Pt to be escorted out by NT, to be driven home herself.

## 2017-03-21 ENCOUNTER — Ambulatory Visit: Payer: Medicare Other | Attending: Internal Medicine | Admitting: Physician Assistant

## 2017-03-21 VITALS — BP 168/98 | HR 88 | Temp 97.7°F | Ht 63.0 in | Wt 153.8 lb

## 2017-03-21 DIAGNOSIS — Z5189 Encounter for other specified aftercare: Secondary | ICD-10-CM | POA: Diagnosis not present

## 2017-03-21 DIAGNOSIS — A0472 Enterocolitis due to Clostridium difficile, not specified as recurrent: Secondary | ICD-10-CM

## 2017-03-21 DIAGNOSIS — Z90710 Acquired absence of both cervix and uterus: Secondary | ICD-10-CM | POA: Diagnosis not present

## 2017-03-21 DIAGNOSIS — Z880 Allergy status to penicillin: Secondary | ICD-10-CM | POA: Diagnosis not present

## 2017-03-21 DIAGNOSIS — Z7982 Long term (current) use of aspirin: Secondary | ICD-10-CM | POA: Diagnosis not present

## 2017-03-21 DIAGNOSIS — I10 Essential (primary) hypertension: Secondary | ICD-10-CM | POA: Diagnosis not present

## 2017-03-21 DIAGNOSIS — R51 Headache: Secondary | ICD-10-CM | POA: Diagnosis not present

## 2017-03-21 DIAGNOSIS — Z79899 Other long term (current) drug therapy: Secondary | ICD-10-CM | POA: Diagnosis not present

## 2017-03-21 NOTE — Progress Notes (Signed)
[name]  XJO:[id number]  EBR:[id number]  DOB - 1951-03-14  Chief Complaint  Patient presents with  . Hospitalization Follow-up    difficile colitis       Subjective:   [name] is a 66 y.o. female here today for establishment of care. She has a history of high blood pressure and high cholesterol. She just recently lost her job and lost her prescription drug coverage. She does have Medicare. Nevertheless for the last several weeks she was experiencing increasing abdominal pain and diarrhea. She also been having some decrease in her urinary output. She was having some dental pain and saw a primary care provider last month and was started on antibiotics. This resulted in a C. difficile colitis infection and she was hospitalized July 12-15. At the end of this hospitalization she was sent home with recommendations for vancomycin orally. She was unable to get this field secondary to cost. One day later she re-presented to the emergency department with more diarrhea, foul odor and low-grade fevers. Due to cost she was switched to Flagyl. 2 days later she came back to the emergency room with the same symptoms. Her white blood cell count was up. Her creatinine was up. Her hemoglobin was down. Her LFTs still improving from a previous hospitalization. She was admitted to the hospital and treated appropriately. At discharge this time vancomycin 125 mg 4 times daily was recommended to complete a 14 day course. During one of these hospitalizations her blood pressure was a little labile and she was taken off of her routine lisinopril/hydrochlorothiazide. She has been off this medication since that time.  She states that she has a headache today. From a diarrhea standpoint her stools haven't lessened but are still there. The odor has improved some. No abdominal pain. Compliant with her medications.  ROS: GEN: denies fever or chills, denies change in weight Skin: denies lesions or rashes HEENT:  denies headache, earache, epistaxis, sore throat, or neck pain LUNGS: denies SHOB, dyspnea, PND, orthopnea CV: denies CP or palpitations ABD: denies abd pain, N or V +D EXT: denies muscle spasms or swelling; no pain in lower ext, no weakness NEURO: denies numbness or tingling, denies sz, stroke or TIA  ALLERGIES: Allergies  Allergen Reactions  . Penicillins Other (See Comments)    Tightness in throat. MD told pt to use with precaution.   Has patient had a PCN reaction causing immediate rash, facial/tongue/throat swelling, SOB or lightheadedness with hypotension: Yes Has patient had a PCN reaction causing severe rash involving mucus membranes or skin necrosis: Yes Has patient had a PCN reaction that required hospitalization: No Has patient had a PCN reaction occurring within the last 10 years: Yes If all of the above answers are "NO", then may proceed with Cephalosporin use.    PAST MEDICAL HISTORY: Past Medical History:  Diagnosis Date  . History of stomach ulcers 1970s   "in college"  . Hyperlipidemia   . Hypertension   . Obesity (BMI 30.0-34.9)     PAST SURGICAL HISTORY: Past Surgical History:  Procedure Laterality Date  . ABDOMINAL HYSTERECTOMY  2017   "partial; in [place]"  . BLADDER SUSPENSION  2017   "in [place]"  . COLONOSCOPY  2006  . COMBINED HYSTEROSCOPY DIAGNOSTIC / D&C  01/2005   Archie Endo 01/12/2011  . LAPAROSCOPY  1970s   Archie Endo 01/12/2011    MEDICATIONS AT HOME: Prior to Admission medications   Medication Sig Start Date End Date Taking? Authorizing Provider  aspirin 81 MG tablet  Take 81 mg by mouth daily.   Yes [provider]  cholecalciferol (VITAMIN D) 1000 units tablet Take 1,000 Units by mouth daily.   Yes [provider]  Multiple Vitamin (MULTIVITAMIN) capsule Take 1 capsule by mouth daily.   Yes [provider]  vancomycin (VANCOCIN) 125 MG capsule Take 1 capsule (125 mg total) by mouth 4 (four) times daily. Take as  directed 03/18/17 03/30/17 Yes Velvet Bathe, MD  feeding supplement, ENSURE ENLIVE, (ENSURE ENLIVE) LIQD Take 237 mLs by mouth 3 (three) times daily between meals. Patient not taking: Reported on 03/21/2017 03/13/17   Debbe Odea, MD  lisinopril-hydrochlorothiazide (PRINZIDE,ZESTORETIC) 20-12.5 MG tablet Take 1 tablet by mouth daily.    [provider]    Family History  Problem Relation Age of Onset  . Hypertension Mother   . Hypertension Sister   . Hypertension Maternal Aunt   . Heart disease Maternal Aunt   . Hypertension Maternal Uncle     Social-nonsmoker, nondrinker; recently lost job with [employer]  Objective:   Vitals:   03/21/17 1006  BP: (!) 168/98  Pulse: 88  Temp: 97.7 F (36.5 C)  TempSrc: Oral  SpO2: 99%  Weight: 153 lb 12.8 oz (69.8 kg)  Height: 5\' 3"  (1.6 m)    Exam General appearance : Awake, alert, not in any distress. Speech Clear. Not toxic looking Chest:Good air entry bilaterally, no added sounds  CVS: S1 S2 regular, no murmurs.  Abdomen: Bowel sounds present, Non tender and not distended with no guarding, rigidity or rebound. Extremities: B/L Lower Ext shows no edema, both legs are warm to touch Neurology: Awake alert, and oriented X 3, CN II-XII intact, Non focal Skin:No Rash Wounds:N/A   Assessment & Plan  1. C. Difficile Colitis  -complete a 14 day course of antibiotics  -CBC, CMP today  -may need outpt GI eval at some point   2. HTN  -CBC, CMP today   -if CR ok, restart Lisin/HCTZ as previously prescribed  -DASH diet   Return in about 2 weeks (around 04/04/2017).  The patient was given clear instructions to go to ER or return to medical center if symptoms don't improve, worsen or new problems develop. The patient verbalized understanding. The patient was told to call to get lab results if they haven't heard anything in the next week.   Total time spent with patient was 18 min. Greater than 50 % of this visit was spent face to face  counseling and coordinating care regarding risk factor modification, compliance importance and encouragement, education related to medications and diet.  This note has been created with Surveyor, quantity. Any transcriptional errors are unintentional.    Zettie Pho, PA-C [facility] [place], [place]   03/21/2017, 10:37 AM

## 2017-03-22 LAB — CBC WITH DIFFERENTIAL/PLATELET
Basophils Absolute: 0 10*3/uL (ref 0.0–0.2)
Basos: 0 %
EOS (ABSOLUTE): 0.1 10*3/uL (ref 0.0–0.4)
Eos: 1 %
Hematocrit: 31.9 % — ABNORMAL LOW (ref 34.0–46.6)
Hemoglobin: 10 g/dL — ABNORMAL LOW (ref 11.1–15.9)
Immature Grans (Abs): 0 10*3/uL (ref 0.0–0.1)
Immature Granulocytes: 0 %
Lymphocytes Absolute: 2.2 10*3/uL (ref 0.7–3.1)
Lymphs: 20 %
MCH: 27.5 pg (ref 26.6–33.0)
MCHC: 31.3 g/dL — ABNORMAL LOW (ref 31.5–35.7)
MCV: 88 fL (ref 79–97)
Monocytes Absolute: 0.9 10*3/uL (ref 0.1–0.9)
Monocytes: 8 %
Neutrophils Absolute: 7.9 10*3/uL — ABNORMAL HIGH (ref 1.4–7.0)
Neutrophils: 71 %
Platelets: 795 10*3/uL — ABNORMAL HIGH (ref 150–379)
RBC: 3.64 x10E6/uL — ABNORMAL LOW (ref 3.77–5.28)
RDW: 14.8 % (ref 12.3–15.4)
WBC: 11.2 10*3/uL — ABNORMAL HIGH (ref 3.4–10.8)

## 2017-03-22 LAB — BASIC METABOLIC PANEL
BUN/Creatinine Ratio: 8 — ABNORMAL LOW (ref 12–28)
BUN: 4 mg/dL — ABNORMAL LOW (ref 8–27)
CO2: 25 mmol/L (ref 20–29)
Calcium: 9 mg/dL (ref 8.7–10.3)
Chloride: 99 mmol/L (ref 96–106)
Creatinine, Ser: 0.51 mg/dL — ABNORMAL LOW (ref 0.57–1.00)
GFR calc Af Amer: 117 mL/min/{1.73_m2} (ref >59)
GFR calc non Af Amer: 101 mL/min/{1.73_m2} (ref >59)
Glucose: 87 mg/dL (ref 65–99)
Potassium: 4.8 mmol/L (ref 3.5–5.2)
Sodium: 138 mmol/L (ref 134–144)

## 2017-03-23 ENCOUNTER — Inpatient Hospital Stay: Payer: Medicare Other

## 2017-04-04 ENCOUNTER — Ambulatory Visit: Payer: Medicare Other | Attending: Family Medicine | Admitting: Family Medicine

## 2017-04-04 ENCOUNTER — Encounter: Payer: Self-pay | Admitting: Family Medicine

## 2017-04-04 VITALS — BP 116/75 | HR 85 | Temp 98.3°F | Resp 18 | Ht 63.0 in | Wt 154.0 lb

## 2017-04-04 DIAGNOSIS — R143 Flatulence: Secondary | ICD-10-CM

## 2017-04-04 DIAGNOSIS — Z7982 Long term (current) use of aspirin: Secondary | ICD-10-CM | POA: Diagnosis not present

## 2017-04-04 DIAGNOSIS — I10 Essential (primary) hypertension: Secondary | ICD-10-CM | POA: Diagnosis not present

## 2017-04-04 DIAGNOSIS — R109 Unspecified abdominal pain: Secondary | ICD-10-CM | POA: Diagnosis not present

## 2017-04-04 DIAGNOSIS — Z1211 Encounter for screening for malignant neoplasm of colon: Secondary | ICD-10-CM | POA: Diagnosis not present

## 2017-04-04 DIAGNOSIS — Z79899 Other long term (current) drug therapy: Secondary | ICD-10-CM | POA: Diagnosis not present

## 2017-04-04 DIAGNOSIS — Z8619 Personal history of other infectious and parasitic diseases: Secondary | ICD-10-CM | POA: Diagnosis not present

## 2017-04-04 MED ORDER — SACCHAROMYCES BOULARDII 250 MG PO CAPS: 250.0000 mg | ORAL_CAPSULE | Freq: Two times a day (BID) | ORAL | 0 refills | Status: DC

## 2017-04-04 MED ORDER — SIMETHICONE 80 MG PO CHEW: 80.0000 mg | CHEWABLE_TABLET | Freq: Four times a day (QID) | ORAL | 0 refills | Status: DC | PRN

## 2017-04-04 MED ORDER — LISINOPRIL-HYDROCHLOROTHIAZIDE 20-12.5 MG PO TABS: 1.0000 | ORAL_TABLET | Freq: Every day | ORAL | 3 refills | Status: DC

## 2017-04-04 NOTE — Progress Notes (Signed)
Subjective:  Patient ID: [name], female    DOB: 1951/07/26  Age: 66 y.o. MRN: [id number]  CC: Follow-up   HPI Tameeka Luo presents for follow up. history of c.difficile. She denies any hematochezia or melena. She does report intermitment abdominal cramping and gas. She reports formed bowel movements 1 to 2 times per day. History of hypertension.  She is not exercising and is not adherent to low salt diet.  She does not check BP at home. Cardiac symptoms none. Patient denies chest pain, chest pressure/discomfort, claudication, lower extremity edema, near-syncope, palpitations and syncope.  Cardiovascular risk factors: hypertension and sedentary lifestyle. Use of agents associated with hypertension: none. History of target organ damage: none.   Outpatient Medications Prior to Visit  Medication Sig Dispense Refill  . lisinopril-hydrochlorothiazide (PRINZIDE,ZESTORETIC) 20-12.5 MG tablet Take 1 tablet by mouth daily.    Marland Kitchen aspirin 81 MG tablet Take 81 mg by mouth daily.    . cholecalciferol (VITAMIN D) 1000 units tablet Take 1,000 Units by mouth daily.    . feeding supplement, ENSURE ENLIVE, (ENSURE ENLIVE) LIQD Take 237 mLs by mouth 3 (three) times daily between meals. (Patient not taking: Reported on 03/21/2017) 30 Bottle 1  . Multiple Vitamin (MULTIVITAMIN) capsule Take 1 capsule by mouth daily.     No facility-administered medications prior to visit.     ROS Review of Systems  Constitutional: Negative.   HENT: Negative.   Eyes: Negative.   Respiratory: Negative.   Cardiovascular: Negative.   Gastrointestinal: Negative.   Skin: Negative.    Objective:  BP 116/75 (BP Location: Left Arm, Patient Position: Sitting, Cuff Size: Normal)   Pulse 85   Temp 98.3 F (36.8 C) (Oral)   Resp 18   Ht 5\' 3"  (1.6 m)   Wt 154 lb (69.9 kg)   SpO2 98%   BMI 27.28 kg/m   BP/Weight 04/09/2017 04/04/2017 8/93/8101  Systolic BP 751 025 852  Diastolic BP 84 75 98  Wt. (Lbs) - 154 153.8    BMI - 27.28 27.24     Physical Exam  Constitutional: She appears well-developed and well-nourished.  HENT:  Head: Normocephalic and atraumatic.  Right Ear: External ear normal.  Left Ear: External ear normal.  Nose: Nose normal.  Mouth/Throat: Oropharynx is clear and moist.  Eyes: Pupils are equal, round, and reactive to light. Conjunctivae are normal.  Neck: No JVD present.  Cardiovascular: Normal rate, regular rhythm, normal heart sounds and intact distal pulses.   Pulmonary/Chest: Effort normal and breath sounds normal.  Abdominal: Soft. Bowel sounds are normal. There is no tenderness.  Skin: Skin is warm and dry.  Nursing note and vitals reviewed.   Assessment & Plan:   Problem List Items Addressed This Visit      Cardiovascular and Mediastinum   Essential hypertension   Relevant Orders   Lipid Panel (Completed)    Other Visit Diagnoses    History of Clostridium difficile colitis    -  Primary   Relevant Medications   saccharomyces boulardii (FLORASTOR) 250 MG capsule   Flatulence       Relevant Medications   simethicone (MYLICON) 80 MG chewable tablet   Screening for colon cancer       Relevant Orders   Ambulatory referral to Gastroenterology      Meds ordered this encounter  Medications  .  lisinopril-hydrochlorothiazide (PRINZIDE,ZESTORETIC) 20-12.5 MG tablet    Sig: Take 1 tablet by mouth daily.    Dispense:  90 tablet  Refill:  3    Must have office visit for refills.    Order Specific Question:   Supervising Provider    Answer:   Tresa Garter [id number]  .  saccharomyces boulardii (FLORASTOR) 250 MG capsule    Sig: Take 1 capsule (250 mg total) by mouth 2 (two) times daily.    Dispense:  60 capsule    Refill:  0    Order Specific Question:   Supervising Provider    Answer:   Tresa Garter [id number]  .  simethicone (MYLICON) 80 MG chewable tablet    Sig: Chew 1 tablet (80 mg total) by mouth every 6 (six) hours as needed for  flatulence.    Dispense:  30 tablet    Refill:  0    Order Specific Question:   Supervising Provider    Answer:   Tresa Garter [id number]    Follow-up: Return in about 3 months (around 07/05/2017), or if symptoms worsen or fail to improve, for HTN.   Alfonse Spruce FNP

## 2017-04-04 NOTE — Progress Notes (Signed)
Patient is here for f/up  

## 2017-04-04 NOTE — Patient Instructions (Signed)
Clostridium Difficile Infection   Clostridium difficile (C. difficile or C. diff) infection causes inflammation of the large intestine (colon). This condition can result in damage to the lining of your colon and may lead to another condition called colitis. This infection can be passed from person to person (is contagious).  Follow these instructions at home:  Eating and drinking   · Drink enough fluid to keep your pee (urine) clear or pale yellow.  · Avoid drinking:  ? Milk.  ? Caffeine.  ? Alcohol.  · Follow exact instructions from your doctor about how to get enough fluid in your body (rehydrate).  · Eat small meals often instead of large meals.  Medicines   · Take your antibiotic medicine as told by your doctor. Do not stop taking the antibiotic even if you start to feel better unless your doctor told you to do that.  · Take over-the-counter and prescription medicines only as told by your doctor.  · Do not use medicines to help with watery poop (diarrhea).  General instructions   · Wash your hands fully before you prepare food and after you use the bathroom. Make sure people who live with you also wash their  · hands often.  · Clean the surfaces that you touch. Use a product that contains chlorine bleach.  · Keep all follow-up visits as told by your doctor. This is important.  Contact a doctor if:  · Your symptoms do not get better with treatment.  · Your symptoms get worse with treatment.  · Your symptoms go away and then come back.  · You have a fever.  · You have new symptoms.  Get help right away if:  · You have more pain or tenderness in your belly (abdomen).  · Your poop (stool) is mostly bloody.  · Your poop looks dark black and tarry.  · You cannot eat or drink without throwing up (vomiting).  · You have signs of dehydration, such as:  ? Dark pee, very little pee, or no pee.  ? Cracked lips.  ? Not making tears when you cry.  ? Dry mouth.  ? Sunken eyes.  ? Feeling sleepy.  ? Feeling weak.  ? Feeling  dizzy.  This information is not intended to replace advice given to you by your health care provider. Make sure you discuss any questions you have with your health care provider.  Document Released: 06/13/2009 Document Revised: 01/22/2016 Document Reviewed: 02/17/2015  Elsevier Interactive Patient Education © 2017 Elsevier Inc.

## 2017-04-05 LAB — LIPID PANEL
Chol/HDL Ratio: 3.7 ratio (ref 0.0–4.4)
Cholesterol, Total: 245 mg/dL — ABNORMAL HIGH (ref 100–199)
HDL: 67 mg/dL (ref >39)
LDL Calculated: 147 mg/dL — ABNORMAL HIGH (ref 0–99)
Triglycerides: 156 mg/dL — ABNORMAL HIGH (ref 0–149)
VLDL Cholesterol Cal: 31 mg/dL (ref 5–40)

## 2017-04-07 ENCOUNTER — Telehealth: Payer: Self-pay | Admitting: Family Medicine

## 2017-04-07 DIAGNOSIS — I10 Essential (primary) hypertension: Secondary | ICD-10-CM

## 2017-04-07 DIAGNOSIS — R143 Flatulence: Secondary | ICD-10-CM

## 2017-04-07 DIAGNOSIS — Z8619 Personal history of other infectious and parasitic diseases: Secondary | ICD-10-CM

## 2017-04-07 MED ORDER — LISINOPRIL-HYDROCHLOROTHIAZIDE 20-12.5 MG PO TABS: 1.0000 | ORAL_TABLET | Freq: Every day | ORAL | 3 refills | Status: DC

## 2017-04-07 MED ORDER — SACCHAROMYCES BOULARDII 250 MG PO CAPS: 250.0000 mg | ORAL_CAPSULE | Freq: Two times a day (BID) | ORAL | 0 refills | Status: DC

## 2017-04-07 MED ORDER — SIMETHICONE 80 MG PO CHEW: 80.0000 mg | CHEWABLE_TABLET | Freq: Four times a day (QID) | ORAL | 0 refills | Status: DC | PRN

## 2017-04-07 MED FILL — LISINOPRIL-HCTZ 20-12.5 MG: 20-12.5 | 30 days supply | Qty: 30 | Fill #0

## 2017-04-07 NOTE — Telephone Encounter (Signed)
Patient came in would like Rx   lisinopril-hydrochlorothiazide  saccharomyces boulardii  simethicone   sent to [facility] not [facility].  Please call patient and inform when re sent

## 2017-04-07 NOTE — Telephone Encounter (Signed)
Patient came in would like Rx  lisinopril-hydrochlorothiazide . saccharomyces boulardii . simethicone   sent to [facility] not [facility].

## 2017-04-07 NOTE — Telephone Encounter (Signed)
CMA call regarding medication sent to our [facility]   Patient verify DOB   Patient was aware and understood

## 2017-04-07 NOTE — Telephone Encounter (Signed)
Please reorder and have medication sent to our pharmacy.

## 2017-04-08 ENCOUNTER — Telehealth: Payer: Self-pay | Admitting: Family Medicine

## 2017-04-08 NOTE — Telephone Encounter (Signed)
Pt. Called requesting a letter form PCP stating that she is ok to go back to work. Please f/u with pt.

## 2017-04-09 ENCOUNTER — Encounter (HOSPITAL_COMMUNITY): Payer: Self-pay | Admitting: Emergency Medicine

## 2017-04-09 ENCOUNTER — Emergency Department (HOSPITAL_COMMUNITY)
Admission: EM | Admit: 2017-04-09 | Discharge: 2017-04-09 | Disposition: A | Discharge status: Home / Self Care | Payer: Medicare Other | Attending: Emergency Medicine | Admitting: Emergency Medicine

## 2017-04-09 DIAGNOSIS — Z79899 Other long term (current) drug therapy: Secondary | ICD-10-CM | POA: Insufficient documentation

## 2017-04-09 DIAGNOSIS — R197 Diarrhea, unspecified: Secondary | ICD-10-CM | POA: Insufficient documentation

## 2017-04-09 DIAGNOSIS — E785 Hyperlipidemia, unspecified: Secondary | ICD-10-CM | POA: Insufficient documentation

## 2017-04-09 DIAGNOSIS — I10 Essential (primary) hypertension: Secondary | ICD-10-CM | POA: Insufficient documentation

## 2017-04-09 DIAGNOSIS — E860 Dehydration: Secondary | ICD-10-CM | POA: Diagnosis not present

## 2017-04-09 DIAGNOSIS — Z7982 Long term (current) use of aspirin: Secondary | ICD-10-CM | POA: Insufficient documentation

## 2017-04-09 LAB — COMPREHENSIVE METABOLIC PANEL
ALT: 18 U/L (ref 14–54)
AST: 37 U/L (ref 15–41)
Albumin: 3.3 g/dL — ABNORMAL LOW (ref 3.5–5.0)
Alkaline Phosphatase: 55 U/L (ref 38–126)
Anion gap: 8 (ref 5–15)
BUN: 7 mg/dL (ref 6–20)
CO2: 25 mmol/L (ref 22–32)
Calcium: 8.6 mg/dL — ABNORMAL LOW (ref 8.9–10.3)
Chloride: 104 mmol/L (ref 101–111)
Creatinine, Ser: 0.61 mg/dL (ref 0.44–1.00)
GFR calc Af Amer: >60 mL/min (ref >60)
GFR calc non Af Amer: >60 mL/min (ref >60)
Glucose, Bld: 94 mg/dL (ref 65–99)
Potassium: 4.4 mmol/L (ref 3.5–5.1)
Sodium: 137 mmol/L (ref 135–145)
Total Bilirubin: 0.9 mg/dL (ref 0.3–1.2)
Total Protein: 7.9 g/dL (ref 6.5–8.1)

## 2017-04-09 LAB — CBC
HCT: 31.5 % — ABNORMAL LOW (ref 36.0–46.0)
Hemoglobin: 9.9 g/dL — ABNORMAL LOW (ref 12.0–15.0)
MCH: 28.1 pg (ref 26.0–34.0)
MCHC: 31.4 g/dL (ref 30.0–36.0)
MCV: 89.5 fL (ref 78.0–100.0)
Platelets: 336 10*3/uL (ref 150–400)
RBC: 3.52 MIL/uL — ABNORMAL LOW (ref 3.87–5.11)
RDW: 17 % — ABNORMAL HIGH (ref 11.5–15.5)
WBC: 6.6 10*3/uL (ref 4.0–10.5)

## 2017-04-09 LAB — LIPASE, BLOOD: Lipase: 34 U/L (ref 11–51)

## 2017-04-09 MED ORDER — SODIUM CHLORIDE 0.9 % IV BOLUS (SEPSIS)
1000.0000 mL | Freq: Once | INTRAVENOUS | Status: AC
  Administered 2017-04-09: 1000 mL via INTRAVENOUS

## 2017-04-09 NOTE — ED Triage Notes (Signed)
Pt to ER for evaluation of diarrhea onset last night after "eat chicken and bojangles." however states was just recently treated for C diff, states completed antibiotics course. States wants reassurance that she is not experiencing C diff related diarrhea again. Denies pain. Denies nausea. No fever at triage. VSS.

## 2017-04-09 NOTE — ED Provider Notes (Signed)
[facility] DEPT Provider Note   CSN: [id number] Arrival date & time: 04/09/17  0736     History   Chief Complaint Chief Complaint  Patient presents with  . Diarrhea    HPI [name] is a 66 y.o. female history of stomach ulcer, recent C. difficile finished by mouth vancomycin a week ago here presenting with diarrhea. Patient states that she was recently admitted to the hospital and was diagnosed with C. difficile colitis. She just finished vancomycin a week ago and was doing well. She did eat at [employer] yesterday more food than usual. Patient states that she had about 5-7 episodes of loose diarrhea that is not watery since yesterday. No fevers or vomiting or abdominal pain. She was concerned for recurrent C. difficile so came here for evaluation.  The history is provided by the patient.    Past Medical History:  Diagnosis Date  . History of stomach ulcers 1970s   "in college"  . Hyperlipidemia   . Hypertension   . Obesity (BMI 30.0-34.9)     Patient Active Problem List   Diagnosis Date Noted  . Recurrent Clostridium difficile diarrhea 03/16/2017  . C. difficile colitis 03/16/2017  . Colitis, Clostridium difficile 03/16/2017  . Left adrenal mass ([place]) 03/13/2017  . Clostridium difficile colitis 03/10/2017  . AKI (acute kidney injury) ([place])   . Dehydration   . Hypokalemia   . Essential hypertension 08/19/2006    Past Surgical History:  Procedure Laterality Date  . ABDOMINAL HYSTERECTOMY  2017   "partial; in [place]"  . BLADDER SUSPENSION  2017   "in [place]"  . COLONOSCOPY  2006  . COMBINED HYSTEROSCOPY DIAGNOSTIC / D&C  01/2005   Archie Endo 01/12/2011  . LAPAROSCOPY  1970s   Archie Endo 01/12/2011    OB History    No data available       Home Medications    Prior to Admission medications   Medication Sig Start Date End Date Taking? Authorizing Provider  aspirin 81 MG tablet Take 81 mg by mouth daily.    [provider]  cholecalciferol  (VITAMIN D) 1000 units tablet Take 1,000 Units by mouth daily.    [provider]  feeding supplement, ENSURE ENLIVE, (ENSURE ENLIVE) LIQD Take 237 mLs by mouth 3 (three) times daily between meals. Patient not taking: Reported on 03/21/2017 03/13/17   Debbe Odea, MD  lisinopril-hydrochlorothiazide (PRINZIDE,ZESTORETIC) 20-12.5 MG tablet Take 1 tablet by mouth daily. 04/07/17   Alfonse Spruce, FNP  Multiple Vitamin (MULTIVITAMIN) capsule Take 1 capsule by mouth daily.    [provider]  saccharomyces boulardii (FLORASTOR) 250 MG capsule Take 1 capsule (250 mg total) by mouth 2 (two) times daily. 04/07/17   Alfonse Spruce, FNP  simethicone (MYLICON) 80 MG chewable tablet Chew 1 tablet (80 mg total) by mouth every 6 (six) hours as needed for flatulence. 04/07/17   Alfonse Spruce, FNP    Family History Family History  Problem Relation Age of Onset  . Hypertension Mother   . Hypertension Sister   . Hypertension Maternal Aunt   . Heart disease Maternal Aunt   . Hypertension Maternal Uncle     Social History Social History  Substance Use Topics  . Smoking status: Never Smoker  . Smokeless tobacco: Never Used  . Alcohol use No     Allergies   Penicillins   Review of Systems Review of Systems  Gastrointestinal: Positive for diarrhea.  All other systems reviewed and are negative.  Physical Exam Updated Vital Signs BP 125/74   Pulse 84   Temp 98.2 F (36.8 C) (Oral)   Resp 18   SpO2 98%   Physical Exam  Constitutional: She is oriented to person, place, and time.  Slightly anxious   HENT:  Head: Normocephalic.  MM slightly dry   Eyes: Pupils are equal, round, and reactive to light. Conjunctivae and EOM are normal.  Neck: Normal range of motion. Neck supple.  Cardiovascular: Normal rate, regular rhythm and normal heart sounds.   Pulmonary/Chest: Effort normal and breath sounds normal. No respiratory distress. She has no wheezes.    Abdominal: Soft. Bowel sounds are normal. She exhibits no distension. There is no tenderness.  Musculoskeletal: Normal range of motion.  Neurological: She is alert and oriented to person, place, and time. No cranial nerve deficit. Coordination normal.  Skin: Skin is warm.  Psychiatric: She has a normal mood and affect.  Nursing note and vitals reviewed.    ED Treatments / Results  Labs (all labs ordered are listed, but only abnormal results are displayed) Labs Reviewed  COMPREHENSIVE METABOLIC PANEL - Abnormal; Notable for the following:       Result Value   Calcium 8.6 (*)    Albumin 3.3 (*)    All other components within normal limits  CBC - Abnormal; Notable for the following:    RBC 3.52 (*)    Hemoglobin 9.9 (*)    HCT 31.5 (*)    RDW 17.0 (*)    All other components within normal limits  LIPASE, BLOOD    EKG  EKG Interpretation None       Radiology No results found.  Procedures Procedures (including critical care time)  Medications Ordered in ED Medications  sodium chloride 0.9 % bolus 1,000 mL (0 mLs Intravenous Stopped 04/09/17 1050)     Initial Impression / Assessment and Plan / ED Course  I have reviewed the triage vital signs and the nursing notes.  Pertinent labs & imaging results that were available during my care of the patient were reviewed by me and considered in my medical decision making (see chart for details).     [name] is a 66 y.o. female here with diarrhea. Recent C diff and finished PO vanc. Did have fried food yesterday and had some diarrhea. No abdominal tenderness. Will repeat labs, ordered C diff.   11:12 AM Patient observed for several hours. Still unable to produce any stool. WBC nl. When she had C diff recently, she had leukocytosis. Abdomen remained nontender. Likely viral gastro. Told her to eat bland diet, avoid fried food. Gave strict return precautions.     Final Clinical Impressions(s) / ED Diagnoses   Final  diagnoses:  None    New Prescriptions New Prescriptions   No medications on file     Drenda Freeze, MD 04/09/17 1112

## 2017-04-09 NOTE — Discharge Instructions (Signed)
Stay hydrated.   Avoid fried food. Eat bland diet for several days to help with diarrhea.   See your doctor   Return to ER if you have uncontrolled watery diarrhea, severe abdominal pain, vomiting, fever

## 2017-04-11 ENCOUNTER — Other Ambulatory Visit: Payer: Self-pay | Admitting: Family Medicine

## 2017-04-11 NOTE — Telephone Encounter (Signed)
Pt. Called requesting a letter form PCP stating that she is ok to go back to work. Please f/u with pt.

## 2017-04-11 NOTE — Telephone Encounter (Signed)
Please ask patient what injection she is referring to. She did not request injection during last office visit. She can receive tetanus or PNA vaccine as apart of health maintenance if she requests.   Recent history of ED  visit for diarrhea, history of c.diff. Recommend scheduling lab visit for stool culture and bring back stool sample for testing prior to work release.

## 2017-04-11 NOTE — Telephone Encounter (Addendum)
Which injection is patient needing?  Pt asked about work note call when note is ready and schedule apt  For injection.

## 2017-04-12 ENCOUNTER — Ambulatory Visit: Payer: Medicare Other

## 2017-04-12 DIAGNOSIS — R197 Diarrhea, unspecified: Secondary | ICD-10-CM | POA: Diagnosis not present

## 2017-04-12 DIAGNOSIS — I10 Essential (primary) hypertension: Secondary | ICD-10-CM | POA: Diagnosis not present

## 2017-04-12 DIAGNOSIS — Z8619 Personal history of other infectious and parasitic diseases: Secondary | ICD-10-CM | POA: Insufficient documentation

## 2017-04-12 DIAGNOSIS — E785 Hyperlipidemia, unspecified: Secondary | ICD-10-CM | POA: Diagnosis not present

## 2017-04-12 NOTE — Telephone Encounter (Signed)
Patient is picking up the cup to get a stool sample at home so she can bring it back   Patient stated that she wanted the tetanus & PNA shot , she stated that she spoke with the nurse about scheduling but the nurse was going tell her when was going be the best day to come

## 2017-04-12 NOTE — Telephone Encounter (Signed)
Attempt to call to schedule apt. For immunizations  Please schedule appointment for patient to have immunizations when she calls.

## 2017-04-12 NOTE — Telephone Encounter (Signed)
Attempt to call patient. Unable to leave message on voicemail.  PLEASE SCHEDULE APPOINTMENT FOR PATIENT when she calls.

## 2017-04-13 ENCOUNTER — Ambulatory Visit: Payer: Medicare Other

## 2017-04-13 ENCOUNTER — Other Ambulatory Visit: Payer: Self-pay | Admitting: Family Medicine

## 2017-04-13 DIAGNOSIS — A0471 Enterocolitis due to Clostridium difficile, recurrent: Secondary | ICD-10-CM

## 2017-04-13 DIAGNOSIS — E782 Mixed hyperlipidemia: Secondary | ICD-10-CM

## 2017-04-13 MED ORDER — ATORVASTATIN CALCIUM 20 MG PO TABS: 20.0000 mg | ORAL_TABLET | Freq: Every day | ORAL | 2 refills | Status: AC

## 2017-04-13 NOTE — Telephone Encounter (Signed)
Patient called back to review results, and is asking about a letter she requested. Please f/up

## 2017-04-13 NOTE — Progress Notes (Signed)
Patient here for lab drop off 

## 2017-04-13 NOTE — Telephone Encounter (Signed)
CMA call regarding lab results  Patient did not answer & unable to leave message  

## 2017-04-13 NOTE — Telephone Encounter (Signed)
-----   Message from Alfonse Spruce, [place] sent at 04/13/2017  9:00 AM EDT ----- -Lipid levels were elevated. This can increase your risk of heart disease. You will be prescribed atorvastatin to help lower risk.  -Start eating a diet low in saturated fat. Limit your intake of fried foods, red meats, and whole milk. Begin exercising at least 3-5 times per week for 30 minutes. -Recommend follow up in 3 months.

## 2017-04-14 NOTE — Telephone Encounter (Signed)
CMA call to f/up with patient about her letter   Patient did not answer & unable to leave message

## 2017-04-14 NOTE — Telephone Encounter (Signed)
Pt. Called to check the status of her letter to return back to work. Please f/u with pt.

## 2017-04-15 ENCOUNTER — Ambulatory Visit: Payer: Medicare Other | Attending: Family Medicine | Admitting: *Deleted

## 2017-04-15 ENCOUNTER — Other Ambulatory Visit: Payer: Self-pay | Admitting: Family Medicine

## 2017-04-15 DIAGNOSIS — Z23 Encounter for immunization: Secondary | ICD-10-CM | POA: Diagnosis not present

## 2017-04-15 NOTE — Progress Notes (Signed)
Pt request Tdap and PNA Administered Tdap and PNA 13

## 2017-04-19 NOTE — Telephone Encounter (Signed)
Pt. Called stating that she would like to speak with her nurse regarding the results for her immunizations that she had done on 8/17. Pt. States that she would also like to check on the status of her letter. Pt. Would like for the nurse to leave a message in her chart regarding her letter and results if she is not available by phone. Please f/u

## 2017-04-20 ENCOUNTER — Other Ambulatory Visit: Payer: Self-pay | Admitting: Family Medicine

## 2017-04-20 ENCOUNTER — Telehealth: Payer: Self-pay

## 2017-04-20 LAB — STOOL CULTURE: E coli, Shiga toxin Assay: NEGATIVE

## 2017-04-20 NOTE — Telephone Encounter (Signed)
-----   Message from Alfonse Spruce, [place] sent at 04/20/2017  8:45 AM EDT ----- Complete results of stool culture were received today. Stool culture negative. Note to return back to work will be available for pick up in office this afternoon.

## 2017-04-20 NOTE — Telephone Encounter (Signed)
CMA call regarding letter to work is ready to pick up at front desk   Patient verify DOB   Patient was aware and understood

## 2017-05-05 ENCOUNTER — Encounter: Payer: Self-pay | Admitting: Family Medicine

## 2017-05-16 ENCOUNTER — Encounter: Payer: Self-pay | Admitting: *Deleted

## 2017-05-27 DIAGNOSIS — Z9889 Other specified postprocedural states: Secondary | ICD-10-CM | POA: Diagnosis not present

## 2017-05-27 DIAGNOSIS — N812 Incomplete uterovaginal prolapse: Secondary | ICD-10-CM | POA: Diagnosis not present

## 2017-05-27 DIAGNOSIS — N8110 Cystocele, unspecified: Secondary | ICD-10-CM | POA: Diagnosis not present

## 2017-05-27 DIAGNOSIS — Z6828 Body mass index (BMI) 28.0-28.9, adult: Secondary | ICD-10-CM | POA: Diagnosis not present

## 2017-07-05 ENCOUNTER — Ambulatory Visit: Payer: Medicare Other | Admitting: Family Medicine

## 2017-07-25 ENCOUNTER — Encounter (HOSPITAL_COMMUNITY): Payer: Self-pay

## 2017-08-04 DIAGNOSIS — Z823 Family history of stroke: Secondary | ICD-10-CM | POA: Diagnosis not present

## 2017-08-04 DIAGNOSIS — Z0000 Encounter for general adult medical examination without abnormal findings: Secondary | ICD-10-CM | POA: Diagnosis not present

## 2017-08-04 DIAGNOSIS — Z1211 Encounter for screening for malignant neoplasm of colon: Secondary | ICD-10-CM | POA: Diagnosis not present

## 2017-08-04 DIAGNOSIS — Z1231 Encounter for screening mammogram for malignant neoplasm of breast: Secondary | ICD-10-CM | POA: Diagnosis not present

## 2017-08-04 DIAGNOSIS — I10 Essential (primary) hypertension: Secondary | ICD-10-CM | POA: Diagnosis not present

## 2017-08-04 DIAGNOSIS — Z8619 Personal history of other infectious and parasitic diseases: Secondary | ICD-10-CM | POA: Diagnosis not present

## 2017-08-04 DIAGNOSIS — E785 Hyperlipidemia, unspecified: Secondary | ICD-10-CM | POA: Diagnosis not present

## 2017-08-04 DIAGNOSIS — Z23 Encounter for immunization: Secondary | ICD-10-CM | POA: Diagnosis not present

## 2017-08-15 ENCOUNTER — Encounter: Payer: Self-pay | Admitting: Gastroenterology

## 2017-08-17 DIAGNOSIS — E785 Hyperlipidemia, unspecified: Secondary | ICD-10-CM | POA: Diagnosis not present

## 2017-08-17 DIAGNOSIS — R1033 Periumbilical pain: Secondary | ICD-10-CM | POA: Diagnosis not present

## 2017-08-17 DIAGNOSIS — I10 Essential (primary) hypertension: Secondary | ICD-10-CM | POA: Diagnosis not present

## 2017-08-25 ENCOUNTER — Other Ambulatory Visit: Payer: Self-pay | Admitting: Nurse Practitioner

## 2017-08-25 DIAGNOSIS — R1011 Right upper quadrant pain: Secondary | ICD-10-CM | POA: Diagnosis not present

## 2017-08-25 DIAGNOSIS — R1010 Upper abdominal pain, unspecified: Secondary | ICD-10-CM

## 2017-08-26 ENCOUNTER — Ambulatory Visit
Admission: RE | Admit: 2017-08-26 | Discharge: 2017-08-26 | Disposition: A | Discharge status: Home / Self Care | Payer: Medicare Other | Source: Ambulatory Visit | Attending: Nurse Practitioner | Admitting: Nurse Practitioner

## 2017-08-26 DIAGNOSIS — R1010 Upper abdominal pain, unspecified: Secondary | ICD-10-CM

## 2017-08-26 DIAGNOSIS — R109 Unspecified abdominal pain: Secondary | ICD-10-CM | POA: Diagnosis not present

## 2017-09-01 ENCOUNTER — Ambulatory Visit: Payer: Medicare Other | Admitting: Gastroenterology

## 2017-11-07 DIAGNOSIS — I10 Essential (primary) hypertension: Secondary | ICD-10-CM | POA: Diagnosis not present

## 2017-11-07 DIAGNOSIS — E785 Hyperlipidemia, unspecified: Secondary | ICD-10-CM | POA: Diagnosis not present

## 2017-11-07 DIAGNOSIS — K589 Irritable bowel syndrome without diarrhea: Secondary | ICD-10-CM | POA: Diagnosis not present

## 2017-11-09 DIAGNOSIS — E785 Hyperlipidemia, unspecified: Secondary | ICD-10-CM | POA: Diagnosis not present

## 2017-12-06 DIAGNOSIS — H40013 Open angle with borderline findings, low risk, bilateral: Secondary | ICD-10-CM | POA: Diagnosis not present

## 2017-12-06 DIAGNOSIS — H10413 Chronic giant papillary conjunctivitis, bilateral: Secondary | ICD-10-CM | POA: Diagnosis not present

## 2017-12-06 DIAGNOSIS — H2513 Age-related nuclear cataract, bilateral: Secondary | ICD-10-CM | POA: Diagnosis not present

## 2017-12-06 DIAGNOSIS — H11153 Pinguecula, bilateral: Secondary | ICD-10-CM | POA: Diagnosis not present

## 2017-12-13 DIAGNOSIS — H40013 Open angle with borderline findings, low risk, bilateral: Secondary | ICD-10-CM | POA: Diagnosis not present

## 2017-12-13 DIAGNOSIS — H2513 Age-related nuclear cataract, bilateral: Secondary | ICD-10-CM | POA: Diagnosis not present

## 2017-12-13 DIAGNOSIS — H10413 Chronic giant papillary conjunctivitis, bilateral: Secondary | ICD-10-CM | POA: Diagnosis not present

## 2017-12-13 DIAGNOSIS — H4089 Other specified glaucoma: Secondary | ICD-10-CM | POA: Diagnosis not present

## 2017-12-13 DIAGNOSIS — H11153 Pinguecula, bilateral: Secondary | ICD-10-CM | POA: Diagnosis not present

## 2017-12-24 ENCOUNTER — Emergency Department (HOSPITAL_COMMUNITY)
Admission: EM | Admit: 2017-12-24 | Discharge: 2017-12-25 | Disposition: A | Discharge status: Home / Self Care | Payer: No Typology Code available for payment source | Attending: Emergency Medicine | Admitting: Emergency Medicine

## 2017-12-24 DIAGNOSIS — Z7982 Long term (current) use of aspirin: Secondary | ICD-10-CM | POA: Diagnosis not present

## 2017-12-24 DIAGNOSIS — Z79899 Other long term (current) drug therapy: Secondary | ICD-10-CM | POA: Insufficient documentation

## 2017-12-24 DIAGNOSIS — S0990XA Unspecified injury of head, initial encounter: Secondary | ICD-10-CM | POA: Diagnosis not present

## 2017-12-24 DIAGNOSIS — R0781 Pleurodynia: Secondary | ICD-10-CM | POA: Insufficient documentation

## 2017-12-24 DIAGNOSIS — Y999 Unspecified external cause status: Secondary | ICD-10-CM | POA: Insufficient documentation

## 2017-12-24 DIAGNOSIS — R51 Headache: Secondary | ICD-10-CM | POA: Insufficient documentation

## 2017-12-24 DIAGNOSIS — S299XXA Unspecified injury of thorax, initial encounter: Secondary | ICD-10-CM | POA: Diagnosis not present

## 2017-12-24 DIAGNOSIS — Y92410 Unspecified street and highway as the place of occurrence of the external cause: Secondary | ICD-10-CM | POA: Insufficient documentation

## 2017-12-24 DIAGNOSIS — Y9389 Activity, other specified: Secondary | ICD-10-CM | POA: Insufficient documentation

## 2017-12-24 DIAGNOSIS — I10 Essential (primary) hypertension: Secondary | ICD-10-CM | POA: Diagnosis not present

## 2017-12-24 DIAGNOSIS — S199XXA Unspecified injury of neck, initial encounter: Secondary | ICD-10-CM | POA: Diagnosis not present
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
## 2017-12-25 ENCOUNTER — Emergency Department (HOSPITAL_COMMUNITY): Payer: No Typology Code available for payment source

## 2017-12-25 ENCOUNTER — Encounter (HOSPITAL_COMMUNITY): Payer: Self-pay | Admitting: Emergency Medicine

## 2017-12-25 DIAGNOSIS — S0990XA Unspecified injury of head, initial encounter: Secondary | ICD-10-CM | POA: Diagnosis not present

## 2017-12-25 DIAGNOSIS — S299XXA Unspecified injury of thorax, initial encounter: Secondary | ICD-10-CM | POA: Diagnosis not present

## 2017-12-25 DIAGNOSIS — S199XXA Unspecified injury of neck, initial encounter: Secondary | ICD-10-CM | POA: Diagnosis not present

## 2017-12-25 MED ORDER — NAPROXEN 375 MG PO TABS: 375.0000 mg | ORAL_TABLET | Freq: Two times a day (BID) | ORAL | 0 refills | Status: DC

## 2017-12-25 MED ORDER — NAPROXEN 500 MG PO TABS
500.0000 mg | ORAL_TABLET | Freq: Once | ORAL | Status: AC
  Administered 2017-12-25: 500 mg via ORAL
  Filled 2017-12-25: qty 1

## 2017-12-25 MED ORDER — METHOCARBAMOL 500 MG PO TABS: 500.0000 mg | ORAL_TABLET | Freq: Two times a day (BID) | ORAL | 0 refills | Status: DC

## 2017-12-25 MED ORDER — METHOCARBAMOL 500 MG PO TABS
1000.0000 mg | ORAL_TABLET | Freq: Once | ORAL | Status: AC
  Administered 2017-12-25: 1000 mg via ORAL
  Filled 2017-12-25: qty 2
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
## 2018-01-30 DIAGNOSIS — J019 Acute sinusitis, unspecified: Secondary | ICD-10-CM | POA: Diagnosis not present

## 2018-03-01 DIAGNOSIS — E785 Hyperlipidemia, unspecified: Secondary | ICD-10-CM | POA: Diagnosis not present

## 2018-03-01 DIAGNOSIS — R252 Cramp and spasm: Secondary | ICD-10-CM | POA: Diagnosis not present

## 2018-03-01 DIAGNOSIS — K589 Irritable bowel syndrome without diarrhea: Secondary | ICD-10-CM | POA: Diagnosis not present

## 2018-03-09 ENCOUNTER — Other Ambulatory Visit: Payer: Self-pay | Admitting: Gastroenterology

## 2018-03-09 DIAGNOSIS — R1084 Generalized abdominal pain: Secondary | ICD-10-CM

## 2018-03-21 ENCOUNTER — Other Ambulatory Visit: Payer: Self-pay | Admitting: Gastroenterology

## 2018-03-21 ENCOUNTER — Ambulatory Visit
Admission: RE | Admit: 2018-03-21 | Discharge: 2018-03-21 | Disposition: A | Discharge status: Home / Self Care | Payer: Medicare HMO | Source: Ambulatory Visit | Attending: Gastroenterology | Admitting: Gastroenterology

## 2018-03-21 DIAGNOSIS — R1084 Generalized abdominal pain: Secondary | ICD-10-CM

## 2018-03-21 DIAGNOSIS — D3502 Benign neoplasm of left adrenal gland: Secondary | ICD-10-CM | POA: Diagnosis not present

## 2018-05-16 DIAGNOSIS — M2560 Stiffness of unspecified joint, not elsewhere classified: Secondary | ICD-10-CM | POA: Diagnosis not present

## 2018-05-16 DIAGNOSIS — M9903 Segmental and somatic dysfunction of lumbar region: Secondary | ICD-10-CM | POA: Diagnosis not present

## 2018-05-16 DIAGNOSIS — R293 Abnormal posture: Secondary | ICD-10-CM | POA: Diagnosis not present

## 2018-05-16 DIAGNOSIS — M545 Low back pain: Secondary | ICD-10-CM | POA: Diagnosis not present

## 2018-05-29 DIAGNOSIS — R293 Abnormal posture: Secondary | ICD-10-CM | POA: Diagnosis not present

## 2018-05-29 DIAGNOSIS — M9903 Segmental and somatic dysfunction of lumbar region: Secondary | ICD-10-CM | POA: Diagnosis not present

## 2018-05-29 DIAGNOSIS — M2560 Stiffness of unspecified joint, not elsewhere classified: Secondary | ICD-10-CM | POA: Diagnosis not present

## 2018-05-29 DIAGNOSIS — M545 Low back pain: Secondary | ICD-10-CM | POA: Diagnosis not present

## 2018-06-13 DIAGNOSIS — Z23 Encounter for immunization: Secondary | ICD-10-CM | POA: Diagnosis not present

## 2018-07-24 DIAGNOSIS — M62838 Other muscle spasm: Secondary | ICD-10-CM | POA: Diagnosis not present

## 2018-08-15 DIAGNOSIS — G44209 Tension-type headache, unspecified, not intractable: Secondary | ICD-10-CM | POA: Diagnosis not present

## 2018-08-15 DIAGNOSIS — I10 Essential (primary) hypertension: Secondary | ICD-10-CM | POA: Diagnosis not present

## 2018-08-27 ENCOUNTER — Encounter (HOSPITAL_COMMUNITY): Payer: Self-pay | Admitting: Emergency Medicine

## 2018-08-27 ENCOUNTER — Other Ambulatory Visit: Payer: Self-pay

## 2018-08-27 ENCOUNTER — Emergency Department (HOSPITAL_COMMUNITY): Payer: Medicare HMO

## 2018-08-27 ENCOUNTER — Emergency Department (HOSPITAL_COMMUNITY)
Admission: EM | Admit: 2018-08-27 | Discharge: 2018-08-27 | Disposition: A | Discharge status: Home / Self Care | Payer: Medicare HMO | Attending: Emergency Medicine | Admitting: Emergency Medicine

## 2018-08-27 DIAGNOSIS — Y999 Unspecified external cause status: Secondary | ICD-10-CM | POA: Insufficient documentation

## 2018-08-27 DIAGNOSIS — Y929 Unspecified place or not applicable: Secondary | ICD-10-CM | POA: Diagnosis not present

## 2018-08-27 DIAGNOSIS — S299XXA Unspecified injury of thorax, initial encounter: Secondary | ICD-10-CM | POA: Diagnosis not present

## 2018-08-27 DIAGNOSIS — R0789 Other chest pain: Secondary | ICD-10-CM | POA: Diagnosis not present

## 2018-08-27 DIAGNOSIS — Y9389 Activity, other specified: Secondary | ICD-10-CM | POA: Insufficient documentation

## 2018-08-27 DIAGNOSIS — Z79899 Other long term (current) drug therapy: Secondary | ICD-10-CM | POA: Insufficient documentation

## 2018-08-27 DIAGNOSIS — I10 Essential (primary) hypertension: Secondary | ICD-10-CM | POA: Diagnosis not present

## 2018-08-27 DIAGNOSIS — Z7982 Long term (current) use of aspirin: Secondary | ICD-10-CM | POA: Diagnosis not present

## 2018-08-27 DIAGNOSIS — S20212A Contusion of left front wall of thorax, initial encounter: Secondary | ICD-10-CM | POA: Insufficient documentation

## 2018-08-27 MED ORDER — ACETAMINOPHEN 325 MG PO TABS
650.0000 mg | ORAL_TABLET | Freq: Once | ORAL | Status: AC
  Administered 2018-08-27: 650 mg via ORAL
  Filled 2018-08-27: qty 2

## 2018-08-27 NOTE — ED Notes (Signed)
Reviewed d/c instructions with pt, who verbalized understanding and had no outstanding questions. Pt departed in NAD, refused use of wheelchair.   
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
## 2019-01-05 ENCOUNTER — Other Ambulatory Visit: Payer: Self-pay | Admitting: Family Medicine

## 2019-01-05 DIAGNOSIS — R1011 Right upper quadrant pain: Secondary | ICD-10-CM

## 2019-01-14 DIAGNOSIS — S60453A Superficial foreign body of left middle finger, initial encounter: Secondary | ICD-10-CM | POA: Diagnosis not present

## 2019-01-18 ENCOUNTER — Ambulatory Visit
Admission: RE | Admit: 2019-01-18 | Discharge: 2019-01-18 | Disposition: A | Discharge status: Home / Self Care | Payer: Medicare Other | Source: Ambulatory Visit | Attending: Family Medicine | Admitting: Family Medicine

## 2019-01-18 DIAGNOSIS — R1011 Right upper quadrant pain: Secondary | ICD-10-CM

## 2019-02-08 ENCOUNTER — Ambulatory Visit
Admission: RE | Admit: 2019-02-08 | Discharge: 2019-02-08 | Disposition: A | Discharge status: Home / Self Care | Payer: Medicare Other | Source: Ambulatory Visit | Attending: Family Medicine | Admitting: Family Medicine

## 2019-02-08 ENCOUNTER — Other Ambulatory Visit: Payer: Self-pay | Admitting: Family Medicine

## 2019-02-08 DIAGNOSIS — R52 Pain, unspecified: Secondary | ICD-10-CM

## 2019-03-01 ENCOUNTER — Telehealth: Payer: Self-pay

## 2019-03-01 NOTE — Telephone Encounter (Signed)
ROI fax to [facility] to request patient records

## 2019-04-10 ENCOUNTER — Other Ambulatory Visit: Payer: Self-pay | Admitting: Family Medicine

## 2019-04-10 DIAGNOSIS — Z1231 Encounter for screening mammogram for malignant neoplasm of breast: Secondary | ICD-10-CM

## 2019-04-10 DIAGNOSIS — E2839 Other primary ovarian failure: Secondary | ICD-10-CM

## 2019-04-30 ENCOUNTER — Telehealth: Payer: Self-pay | Admitting: Gastroenterology

## 2019-04-30 NOTE — Telephone Encounter (Signed)
Hi Dr. Havery Moros, we have received patients's previous colon report and path from last year. Pt states that she is due for another colonoscopy this year. Records will be placed on your desk for review. Please advise with scheduling. Thank you.

## 2019-05-01 NOTE — Telephone Encounter (Signed)
Records reviewed - she had a colonoscopy with Dr. Michail Sermon of [facility] done on 09/22/2017 - she had a 67mm ascending colon adenoma that was removed. I don't see any specific recommendations on follow up - normally if resected en bloc this would warrant a 3 year follow up, unless they told her otherwise, if it was difficult to remove and concern for residual polyp it would be reasonable to do it now. I'm happy to see her in clinic first to discuss this and determine timing. Thanks

## 2019-05-01 NOTE — Telephone Encounter (Signed)
Spoke with pt about Dr. Doyne Keel recommendations, she stated that she is having some gi issues and is fine coming for a consult first. She was unable to schedule appt at this moment but will call back when she has her calendar handy.

## 2019-05-02 ENCOUNTER — Encounter: Payer: Self-pay | Admitting: Gastroenterology

## 2019-06-07 ENCOUNTER — Ambulatory Visit: Payer: Self-pay | Admitting: Gastroenterology

## 2019-06-07 NOTE — Progress Notes (Deleted)
HPI :     Records reviewed - she had a colonoscopy with Dr. Michail Sermon of [facility] done on 09/22/2017 - she had a 43mm ascending colon adenoma that was removed. I don't see any specific recommendations on follow up - normally if resected en bloc this would warrant a 3 year follow up, unless they told her otherwise, if it was difficult to remove and concern for residual polyp it would be reasonable to do it now. I'm happy to see her in clinic first to discuss this and determine timing. Thanks    Past Medical History:  Diagnosis Date  . History of stomach ulcers 1970s   "in college"  . Hyperlipidemia   . Hypertension   . Obesity (BMI 30.0-34.9)      Past Surgical History:  Procedure Laterality Date  . ABDOMINAL HYSTERECTOMY  2017   "partial; in [place]"  . BLADDER SUSPENSION  2017   "in [place]"  . COLONOSCOPY  2006  . COMBINED HYSTEROSCOPY DIAGNOSTIC / D&C  01/2005   Archie Endo 01/12/2011  . LAPAROSCOPY  1970s   /notes 01/12/2011   Family History  Problem Relation Age of Onset  . Hypertension Mother   . Hypertension Sister   . Hypertension Maternal Aunt   . Heart disease Maternal Aunt   . Hypertension Maternal Uncle    Social History   Tobacco Use  . Smoking status: Never Smoker  . Smokeless tobacco: Never Used  Substance Use Topics  . Alcohol use: No  . Drug use: No   Current Outpatient Medications  Medication Sig Dispense Refill  . aspirin 81 MG tablet Take 81 mg by mouth daily.    Marland Kitchen atorvastatin (LIPITOR) 20 MG tablet Take 1 tablet (20 mg total) by mouth daily. 30 tablet 2  . cholecalciferol (VITAMIN D) 1000 units tablet Take 1,000 Units by mouth daily.    . feeding supplement, ENSURE ENLIVE, (ENSURE ENLIVE) LIQD Take 237 mLs by mouth 3 (three) times daily between meals. (Patient not taking: Reported on 03/21/2017) 30 Bottle 1  . lisinopril-hydrochlorothiazide (PRINZIDE,ZESTORETIC) 20-12.5 MG tablet Take 1 tablet by mouth daily. 90 tablet 3  . methocarbamol (ROBAXIN)  500 MG tablet Take 1 tablet (500 mg total) by mouth 2 (two) times daily. 14 tablet 0  . Multiple Vitamin (MULTIVITAMIN) capsule Take 1 capsule by mouth daily.    . naproxen (NAPROSYN) 375 MG tablet Take 1 tablet (375 mg total) by mouth 2 (two) times daily. 14 tablet 0  . saccharomyces boulardii (FLORASTOR) 250 MG capsule Take 1 capsule (250 mg total) by mouth 2 (two) times daily. 60 capsule 0  . simethicone (MYLICON) 80 MG chewable tablet Chew 1 tablet (80 mg total) by mouth every 6 (six) hours as needed for flatulence. 30 tablet 0   No current facility-administered medications for this visit.    Allergies  Allergen Reactions  . Penicillins Other (See Comments)    Tightness in throat. MD told pt to use with precaution.   Has patient had a PCN reaction causing immediate rash, facial/tongue/throat swelling, SOB or lightheadedness with hypotension: Yes Has patient had a PCN reaction causing severe rash involving mucus membranes or skin necrosis: Yes Has patient had a PCN reaction that required hospitalization: No Has patient had a PCN reaction occurring within the last 10 years: Yes If all of the above answers are "NO", then may proceed with Cephalosporin use.     Review of Systems: All systems reviewed and negative except where noted in HPI.  No results found.  Physical Exam: There were no vitals taken for this visit. Constitutional: Pleasant,well-developed, ***female in no acute distress. HEENT: Normocephalic and atraumatic. Conjunctivae are normal. No scleral icterus. Neck supple.  Cardiovascular: Normal rate, regular rhythm.  Pulmonary/chest: Effort normal and breath sounds normal. No wheezing, rales or rhonchi. Abdominal: Soft, nondistended, nontender. Bowel sounds active throughout. There are no masses palpable. No hepatomegaly. Extremities: no edema Lymphadenopathy: No cervical adenopathy noted. Neurological: Alert and oriented to person place and time. Skin: Skin is warm  and dry. No rashes noted. Psychiatric: Normal mood and affect. Behavior is normal.   ASSESSMENT AND PLAN:  Hayden Rasmussen, MD

## 2019-07-03 IMAGING — US ULTRASOUND ABDOMEN LIMITED
1 series · 14 of 25 positions shown · non-contrast
Comparison: None.

CLINICAL DATA: Right upper quadrant abdominal pain for 2 months

EXAM:
ULTRASOUND ABDOMEN LIMITED RIGHT UPPER QUADRANT

[Series 1: ultrasound abdomen limited · 0.14mm/px · 14 of 45 slices shown]
[im 1/45]
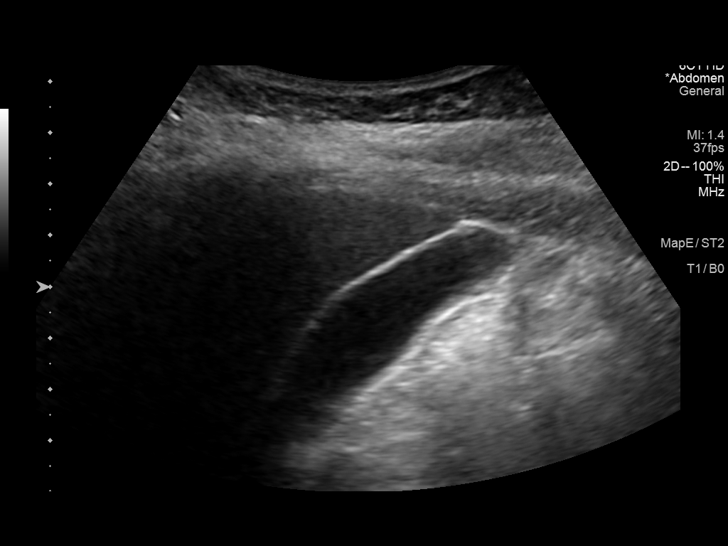
[im 4/45]
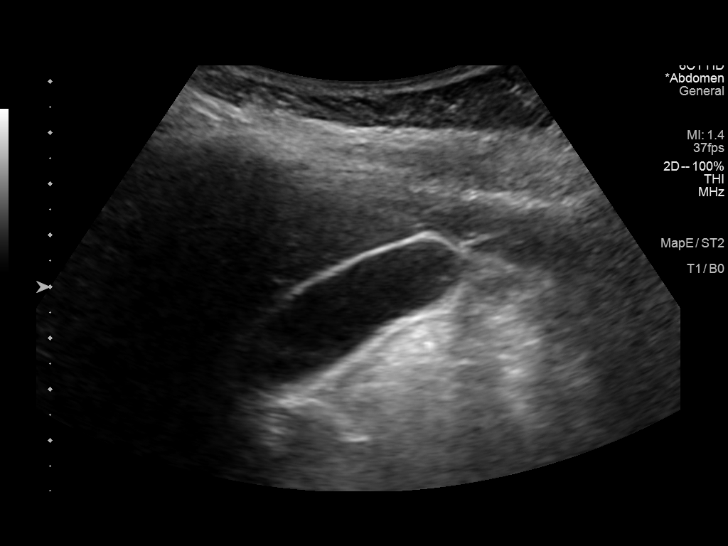
[im 8/45]
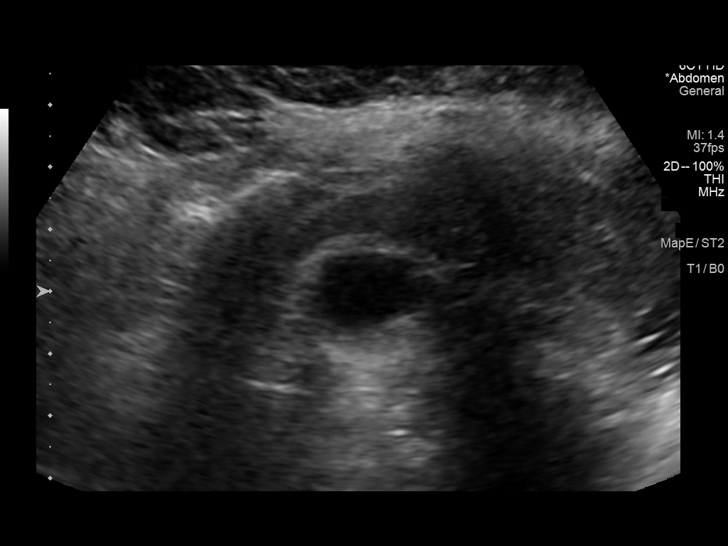
[im 12/45]
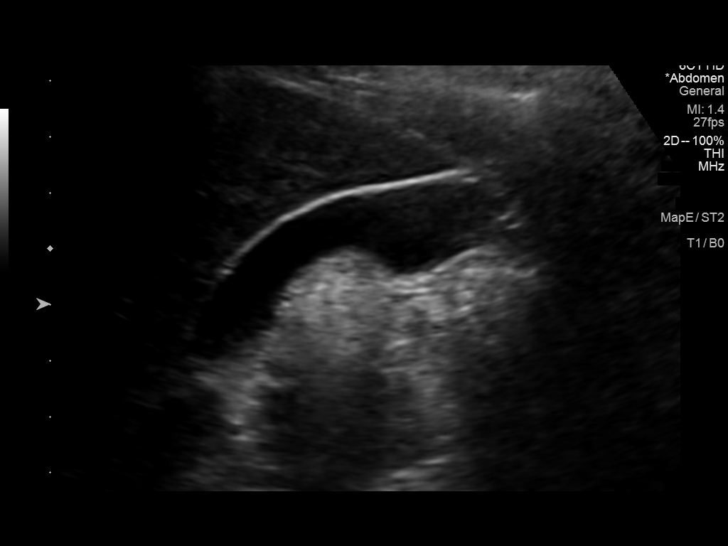
[im 15/45]
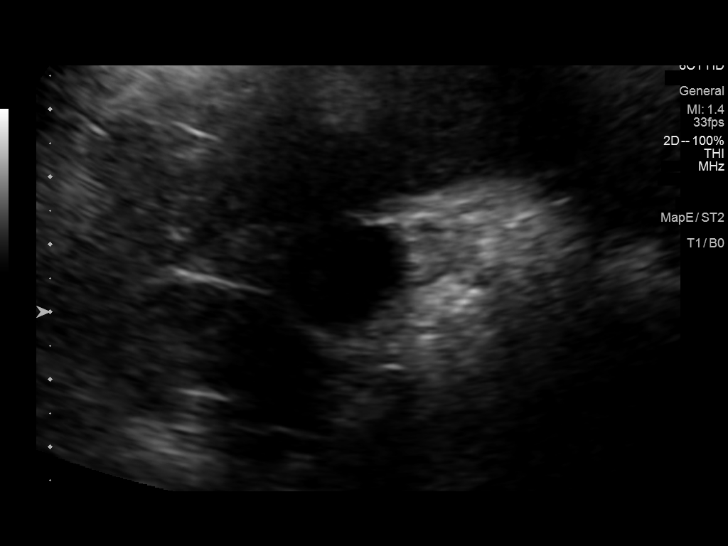
[im 17/45]
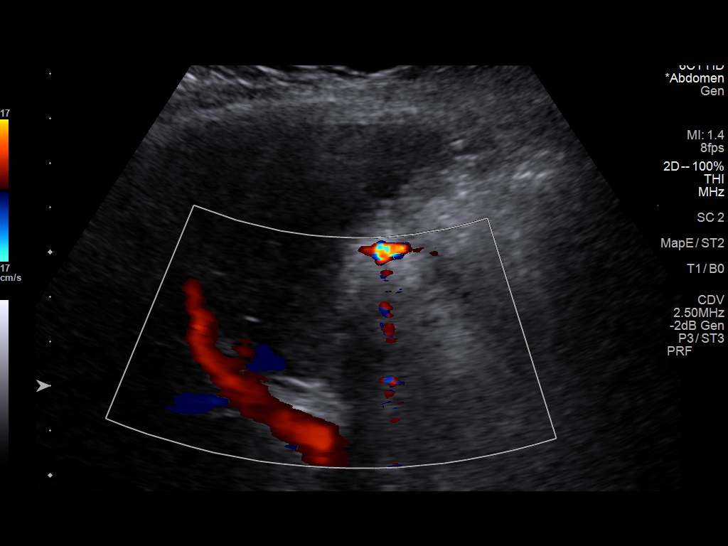
[im 21/45]
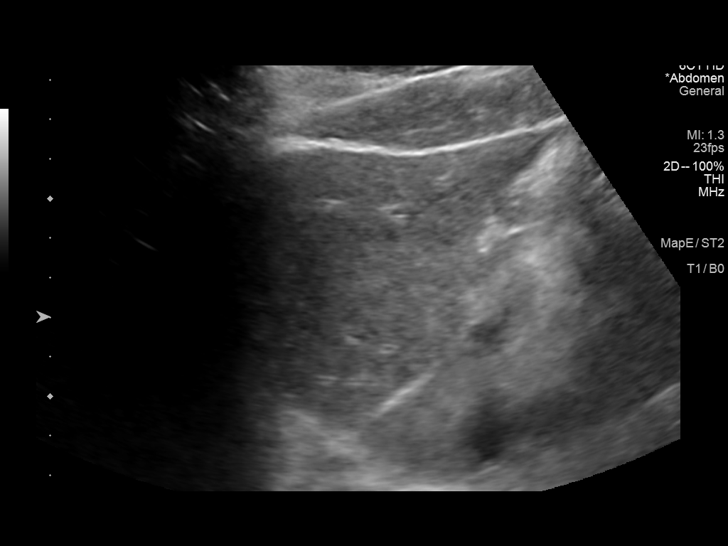
[im 24/45]
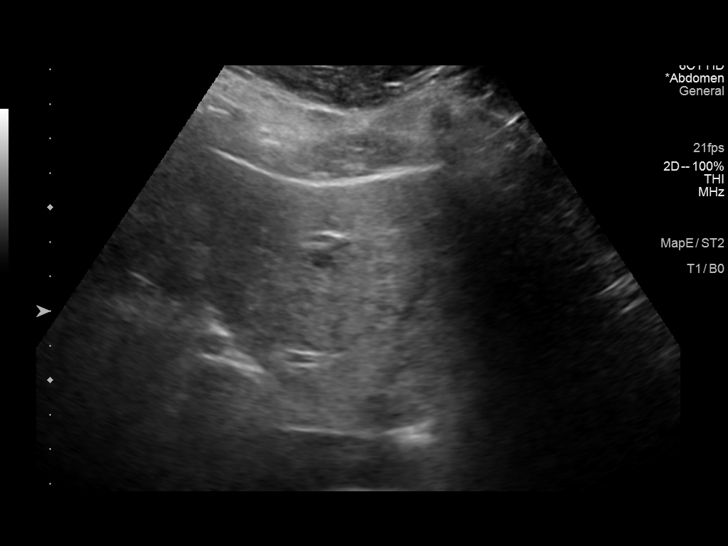
[im 28/45]
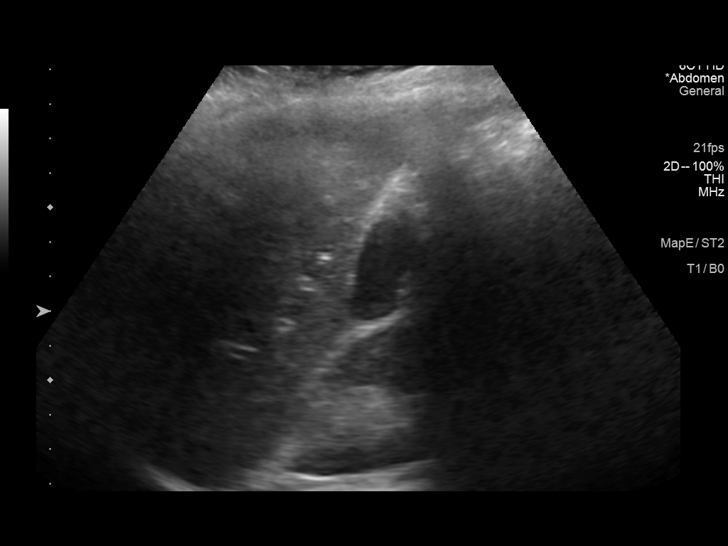
[im 30/45]
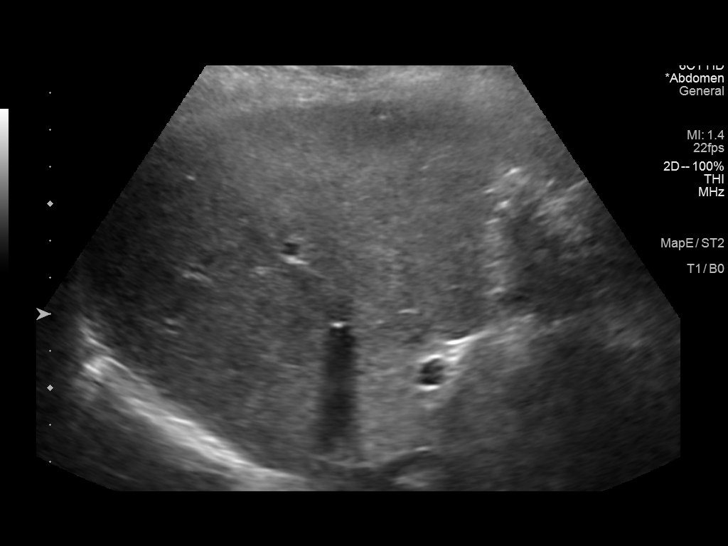
[im 34/45]
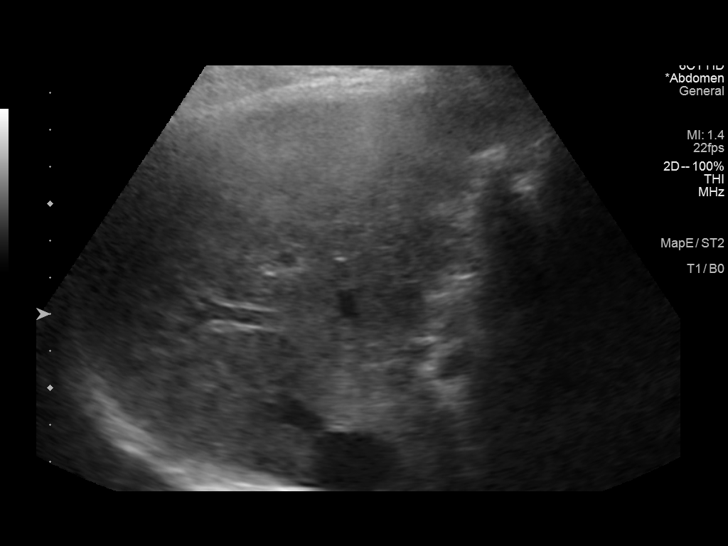
[im 37/45]
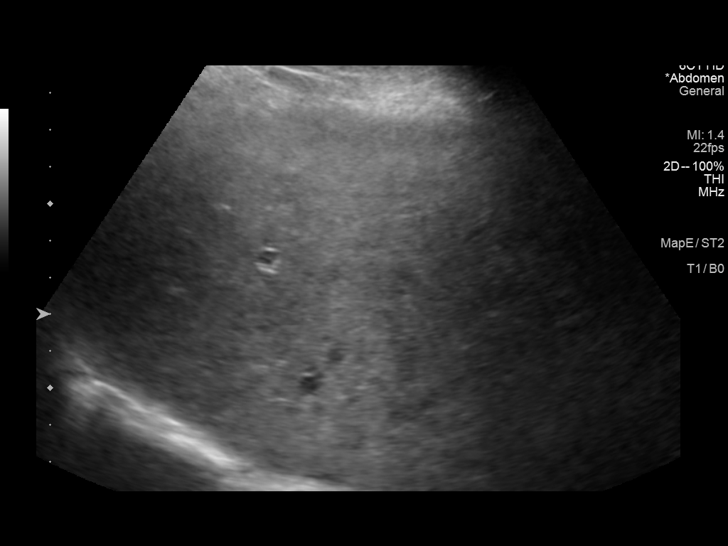
[im 41/45]
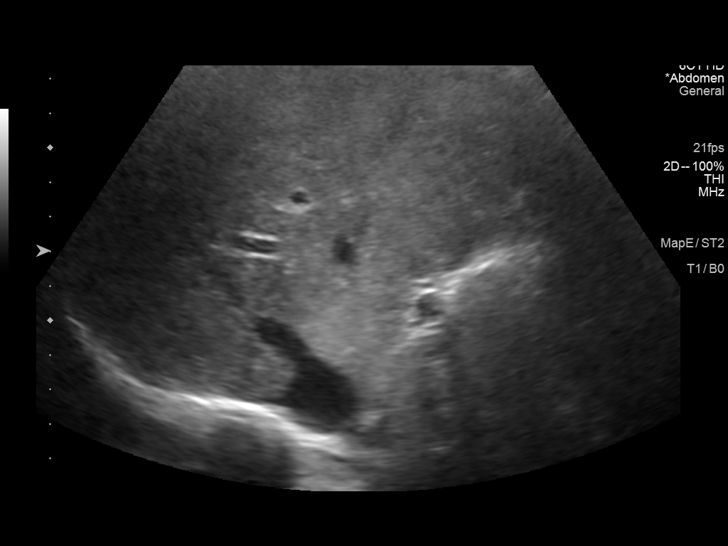
[im 45/45]
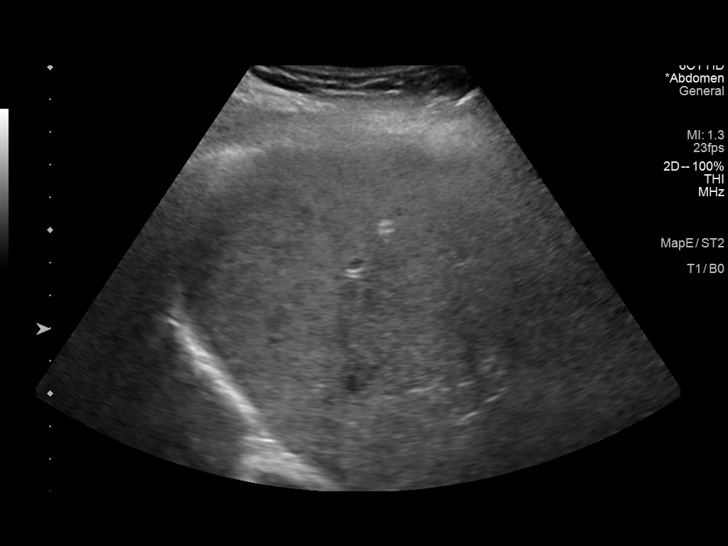

[14 of 25 positions shown; findings below may reference images not displayed]

FINDINGS: Gallbladder:

No gallstones or wall thickening visualized. No sonographic Murphy
sign noted by sonographer.

Common bile duct:

Diameter: 3 mm

Liver:

No focal lesion identified. Within normal limits in parenchymal
echogenicity. Portal vein is patent on color Doppler imaging with
normal direction of blood flow towards the liver.
IMPRESSION: Normal study.  No cause for right upper quadrant pain identified.

## 2019-07-12 ENCOUNTER — Ambulatory Visit
Admission: RE | Admit: 2019-07-12 | Discharge: 2019-07-12 | Disposition: A | Discharge status: Home / Self Care | Payer: Medicare Other | Source: Ambulatory Visit | Attending: Family Medicine | Admitting: Family Medicine

## 2019-07-12 ENCOUNTER — Other Ambulatory Visit: Payer: Self-pay | Admitting: Family Medicine

## 2019-07-12 DIAGNOSIS — M25562 Pain in left knee: Secondary | ICD-10-CM

## 2019-08-17 ENCOUNTER — Other Ambulatory Visit: Payer: Self-pay | Admitting: Family Medicine

## 2019-08-17 DIAGNOSIS — M25562 Pain in left knee: Secondary | ICD-10-CM

## 2019-08-29 ENCOUNTER — Other Ambulatory Visit: Payer: Self-pay | Admitting: Family Medicine

## 2019-08-29 DIAGNOSIS — N200 Calculus of kidney: Secondary | ICD-10-CM

## 2019-09-06 ENCOUNTER — Ambulatory Visit
Admission: RE | Admit: 2019-09-06 | Discharge: 2019-09-06 | Disposition: A | Discharge status: Home / Self Care | Payer: Medicare HMO | Source: Ambulatory Visit | Attending: Family Medicine | Admitting: Family Medicine

## 2019-09-06 ENCOUNTER — Other Ambulatory Visit: Payer: Medicare Other

## 2019-09-06 DIAGNOSIS — M25562 Pain in left knee: Secondary | ICD-10-CM

## 2019-11-02 ENCOUNTER — Ambulatory Visit: Payer: Medicare Other | Attending: Internal Medicine

## 2019-11-02 DIAGNOSIS — Z23 Encounter for immunization: Secondary | ICD-10-CM | POA: Insufficient documentation

## 2019-11-02 NOTE — Progress Notes (Signed)
   Covid-19 Vaccination Clinic  Name:  [name]    MRN: [id number] DOB: May 20, 1951  11/02/2019  [name] was observed post Covid-19 immunization for 15 minutes without incident. She was provided with Vaccine Information Sheet and instruction to access the V-Safe system.   [name] was instructed to call 911 with any severe reactions post vaccine: Marland Kitchen Difficulty breathing  . Swelling of face and throat  . A fast heartbeat  . A bad rash all over body  . Dizziness and weakness   Immunizations Administered    Name Date Dose VIS Date Route   Pfizer COVID-19 Vaccine 11/02/2019  1:48 PM 0.3 mL 08/10/2019 Intramuscular   Manufacturer: [place]   Lot: [id number]   [place]: [id number]

## 2019-11-28 ENCOUNTER — Ambulatory Visit: Payer: Medicare Other | Attending: Internal Medicine

## 2019-11-28 DIAGNOSIS — Z23 Encounter for immunization: Secondary | ICD-10-CM

## 2019-11-28 NOTE — Progress Notes (Signed)
   Covid-19 Vaccination Clinic  Name:  Delasia Blazevic    MRN: [id number] DOB: Sep 29, 1950  11/28/2019  Ms. Corgan was observed post Covid-19 immunization for 15 minutes without incident. She was provided with Vaccine Information Sheet and instruction to access the V-Safe system.   Ms. Rozsa was instructed to call 911 with any severe reactions post vaccine: Marland Kitchen Difficulty breathing  . Swelling of face and throat  . A fast heartbeat  . A bad rash all over body  . Dizziness and weakness   Immunizations Administered    Name Date Dose VIS Date Route   Pfizer COVID-19 Vaccine 11/28/2019  2:07 PM 0.3 mL 08/10/2019 Intramuscular   Manufacturer: [place]   Lot: [id number]   [place]: [id number]

## 2020-04-15 ENCOUNTER — Other Ambulatory Visit: Payer: Self-pay | Admitting: Family Medicine

## 2020-04-15 DIAGNOSIS — Z1231 Encounter for screening mammogram for malignant neoplasm of breast: Secondary | ICD-10-CM

## 2020-07-03 ENCOUNTER — Ambulatory Visit: Payer: Medicare Other | Attending: Internal Medicine

## 2020-07-03 DIAGNOSIS — Z23 Encounter for immunization: Secondary | ICD-10-CM

## 2020-07-03 NOTE — Progress Notes (Signed)
   Covid-19 Vaccination Clinic  Name:  [name]    MRN: [id number] DOB: [birth year]  07/03/2020  Ms. Pack was observed post Covid-19 immunization for 15 minutes without incident. She was provided with Vaccine Information Sheet and instruction to access the V-Safe system.   [name] was instructed to call 911 with any severe reactions post vaccine: Marland Kitchen Difficulty breathing  . Swelling of face and throat  . A fast heartbeat  . A bad rash all over body  . Dizziness and weakness

## 2020-10-10 ENCOUNTER — Other Ambulatory Visit: Payer: Self-pay | Admitting: Family Medicine

## 2020-10-10 DIAGNOSIS — N644 Mastodynia: Secondary | ICD-10-CM

## 2020-11-24 ENCOUNTER — Other Ambulatory Visit: Payer: Medicare Other

## 2020-11-26 ENCOUNTER — Other Ambulatory Visit: Payer: Medicare Other

## 2020-12-03 ENCOUNTER — Ambulatory Visit: Payer: Medicare Other

## 2020-12-03 ENCOUNTER — Other Ambulatory Visit: Payer: Self-pay

## 2021-01-06 ENCOUNTER — Other Ambulatory Visit: Payer: Self-pay

## 2021-01-06 ENCOUNTER — Ambulatory Visit: Admission: RE | Admit: 2021-01-06 | Payer: Medicare Other | Source: Ambulatory Visit

## 2021-01-06 ENCOUNTER — Ambulatory Visit: Payer: Medicare Other

## 2021-01-06 ENCOUNTER — Ambulatory Visit
Admission: RE | Admit: 2021-01-06 | Discharge: 2021-01-06 | Disposition: A | Discharge status: Home / Self Care | Payer: Medicare (Managed Care) | Source: Ambulatory Visit | Attending: Family Medicine | Admitting: Family Medicine

## 2021-01-06 DIAGNOSIS — N644 Mastodynia: Secondary | ICD-10-CM

## 2021-08-08 ENCOUNTER — Encounter (HOSPITAL_COMMUNITY): Payer: Self-pay

## 2021-08-08 ENCOUNTER — Emergency Department (HOSPITAL_COMMUNITY)
Admission: EM | Admit: 2021-08-08 | Discharge: 2021-08-09 | Disposition: A | Discharge status: Home / Self Care | Payer: Medicare (Managed Care) | Attending: Emergency Medicine | Admitting: Emergency Medicine

## 2021-08-08 ENCOUNTER — Other Ambulatory Visit: Payer: Self-pay

## 2021-08-08 DIAGNOSIS — R109 Unspecified abdominal pain: Secondary | ICD-10-CM | POA: Diagnosis present

## 2021-08-08 DIAGNOSIS — R195 Other fecal abnormalities: Secondary | ICD-10-CM | POA: Insufficient documentation

## 2021-08-08 DIAGNOSIS — Z7982 Long term (current) use of aspirin: Secondary | ICD-10-CM | POA: Diagnosis not present

## 2021-08-08 DIAGNOSIS — I10 Essential (primary) hypertension: Secondary | ICD-10-CM | POA: Diagnosis not present

## 2021-08-08 DIAGNOSIS — Z20822 Contact with and (suspected) exposure to covid-19: Secondary | ICD-10-CM | POA: Insufficient documentation

## 2021-08-08 DIAGNOSIS — Z79899 Other long term (current) drug therapy: Secondary | ICD-10-CM | POA: Diagnosis not present

## 2021-08-08 LAB — CBC WITH DIFFERENTIAL/PLATELET
Abs Immature Granulocytes: 0 10*3/uL (ref 0.00–0.07)
Basophils Absolute: 0 10*3/uL (ref 0.0–0.1)
Basophils Relative: 0 %
Eosinophils Absolute: 0 10*3/uL (ref 0.0–0.5)
Eosinophils Relative: 1 %
HCT: 37.5 % (ref 36.0–46.0)
Hemoglobin: 11.7 g/dL — ABNORMAL LOW (ref 12.0–15.0)
Immature Granulocytes: 0 %
Lymphocytes Relative: 40 %
Lymphs Abs: 2.4 10*3/uL (ref 0.7–4.0)
MCH: 28.3 pg (ref 26.0–34.0)
MCHC: 31.2 g/dL (ref 30.0–36.0)
MCV: 90.8 fL (ref 80.0–100.0)
Monocytes Absolute: 0.5 10*3/uL (ref 0.1–1.0)
Monocytes Relative: 9 %
Neutro Abs: 3.1 10*3/uL (ref 1.7–7.7)
Neutrophils Relative %: 50 %
Platelets: 259 10*3/uL (ref 150–400)
RBC: 4.13 MIL/uL (ref 3.87–5.11)
RDW: 14.3 % (ref 11.5–15.5)
WBC: 6.1 10*3/uL (ref 4.0–10.5)
nRBC: 0 % (ref 0.0–0.2)

## 2021-08-08 LAB — URINALYSIS, ROUTINE W REFLEX MICROSCOPIC
Bilirubin Urine: NEGATIVE
Glucose, UA: NEGATIVE mg/dL
Hgb urine dipstick: NEGATIVE
Ketones, ur: NEGATIVE mg/dL
Nitrite: NEGATIVE
Protein, ur: NEGATIVE mg/dL
Specific Gravity, Urine: 1.017 (ref 1.005–1.030)
pH: 7 (ref 5.0–8.0)

## 2021-08-08 LAB — COMPREHENSIVE METABOLIC PANEL
ALT: 25 U/L (ref 0–44)
AST: 25 U/L (ref 15–41)
Albumin: 3.8 g/dL (ref 3.5–5.0)
Alkaline Phosphatase: 60 U/L (ref 38–126)
Anion gap: 8 (ref 5–15)
BUN: 15 mg/dL (ref 8–23)
CO2: 28 mmol/L (ref 22–32)
Calcium: 9.7 mg/dL (ref 8.9–10.3)
Chloride: 101 mmol/L (ref 98–111)
Creatinine, Ser: 0.63 mg/dL (ref 0.44–1.00)
GFR, Estimated: >60 mL/min (ref >60)
Glucose, Bld: 106 mg/dL — ABNORMAL HIGH (ref 70–99)
Potassium: 3.6 mmol/L (ref 3.5–5.1)
Sodium: 137 mmol/L (ref 135–145)
Total Bilirubin: 0.5 mg/dL (ref 0.3–1.2)
Total Protein: 8.4 g/dL — ABNORMAL HIGH (ref 6.5–8.1)

## 2021-08-08 LAB — AMMONIA: Ammonia: 28 umol/L (ref 9–35)

## 2021-08-08 LAB — RESP PANEL BY RT-PCR (FLU A&B, COVID) ARPGX2
Influenza A by PCR: NEGATIVE
Influenza B by PCR: NEGATIVE
SARS Coronavirus 2 by RT PCR: NEGATIVE

## 2021-08-08 NOTE — ED Triage Notes (Signed)
Pt arrived via POV. Pt states she sat on a bench outside a hospital and believes she sat on something infected on bench, but nothing was visible to the eye in June. Pt states her body has a "fume smell" she noticed recently. Pt states she has foul smelling Bmx2wks. Pt denies diarrhea. Pt states her Bms are normal.

## 2021-08-08 NOTE — ED Provider Notes (Signed)
Emergency Medicine Provider Triage Evaluation Note  [name] , a 70 y.o. female  was evaluated in triage.  Pt complains of body odors.  This got worse a few weeks ago.  She was given Hibiclens by her doctor for her odors with out change.  She reports that she isn't haven't diarrhea.    She thinks that this started in June when she visited her sister in the hospital and sat on something and feels that gave her an infection.  She reports foul smelling bowel movements.  She reports that her urine doesn't smell.  She is requesting stool studies today.   Review of Systems  Positive: Body odors, foul smelling stools  Negative: Syncope, fevers  Physical Exam  BP (!) 150/102   Pulse 92   Temp 98.4 F (36.9 C) (Oral)   Resp 18   Ht 5\' 3"  (1.6 m)   Wt 69.9 kg   SpO2 95%   BMI 27.30 kg/m  Gen:   Awake, no distress   Resp:  Normal effort  MSK:   Moves extremities without difficulty  Other:  Patient is awake and alert.  Speech is not slurred.  Medical Decision Making  Medically screening exam initiated at 6:51 PM.  Appropriate orders placed.  [name] was informed that the remainder of the evaluation will be completed by another provider, this initial triage assessment does not replace that evaluation, and the importance of remaining in the ED until their evaluation is complete.  Patient does have a history of C. difficile remotely.  She has had recurrent C. difficile. Additionally given her reports of foul-smelling odors we will check an ammonia, and a urine for ketones.  Note: Portions of this report may have been transcribed using voice recognition software. Every effort was made to ensure accuracy; however, inadvertent computerized transcription errors may be present    [name] 08/08/21 1904    Luna Fuse, MD 08/10/21 [phone]

## 2021-08-09 NOTE — ED Notes (Signed)
The pt  wants to know if shes finished  she thought that the doctor told her she could go

## 2021-08-09 NOTE — ED Provider Notes (Signed)
[facility] EMERGENCY DEPARTMENT Provider Note  CSN: [id number] Arrival date & time: 08/08/21 1806  Chief Complaint(s) foul BM odor  HPI [name] is a 70 y.o. female with a past medical history listed below who presents to the emergency department with several months of intermittent foul-smelling " fumes" especially with having bowel movements.  They have become worse over the past several weeks.  At times associated with intermittent abdominal cramping.  Reports that certain foods exacerbate this.  Denies any recent fevers or infections.  No coughing or congestion.  No known sick contacts.  No suspicious food intake.  This does not feel like C. Diff.  She denies any diarrhea.  No nausea or vomiting.  No other physical complaints.  Patient reports that she has been discussing this with her primary care provider but has not been worked up.   HPI  Past Medical History Past Medical History:  Diagnosis Date   History of stomach ulcers 1970s   "in college"   Hyperlipidemia    Hypertension    Obesity (BMI 30.0-34.9)    Patient Active Problem List   Diagnosis Date Noted   Mixed hyperlipidemia 04/13/2017   Recurrent Clostridium difficile diarrhea 03/16/2017   C. difficile colitis 03/16/2017   Colitis, Clostridium difficile 03/16/2017   Left adrenal mass ([place]) 03/13/2017   Clostridium difficile colitis 03/10/2017   AKI (acute kidney injury) ([place])    Dehydration    Hypokalemia    Essential hypertension 08/19/2006   Home Medication(s) Prior to Admission medications   Medication Sig Start Date End Date Taking? Authorizing Provider  aspirin 81 MG tablet Take 81 mg by mouth daily.    [provider]  atorvastatin (LIPITOR) 20 MG tablet Take 1 tablet (20 mg total) by mouth daily. 04/13/17   Alfonse Spruce, FNP  cholecalciferol (VITAMIN D) 1000 units tablet Take 1,000 Units by mouth daily.    [provider]  feeding supplement, ENSURE  ENLIVE, (ENSURE ENLIVE) LIQD Take 237 mLs by mouth 3 (three) times daily between meals. Patient not taking: Reported on 03/21/2017 03/13/17   Debbe Odea, MD  lisinopril-hydrochlorothiazide (PRINZIDE,ZESTORETIC) 20-12.5 MG tablet Take 1 tablet by mouth daily. 04/07/17   Alfonse Spruce, FNP  methocarbamol (ROBAXIN) 500 MG tablet Take 1 tablet (500 mg total) by mouth 2 (two) times daily. 12/25/17   Palumbo, April, MD  Multiple Vitamin (MULTIVITAMIN) capsule Take 1 capsule by mouth daily.    [provider]  naproxen (NAPROSYN) 375 MG tablet Take 1 tablet (375 mg total) by mouth 2 (two) times daily. 12/25/17   Palumbo, April, MD  saccharomyces boulardii (FLORASTOR) 250 MG capsule Take 1 capsule (250 mg total) by mouth 2 (two) times daily. 04/07/17   Alfonse Spruce, FNP  simethicone (MYLICON) 80 MG chewable tablet Chew 1 tablet (80 mg total) by mouth every 6 (six) hours as needed for flatulence. 04/07/17   Alfonse Spruce, FNP  Past Surgical History Past Surgical History:  Procedure Laterality Date   ABDOMINAL HYSTERECTOMY  2017   "partial; in [place]"   BLADDER SUSPENSION  2017   "in [place]"   COLONOSCOPY  2006   COMBINED HYSTEROSCOPY DIAGNOSTIC / D&C  01/2005   Archie Endo 01/12/2011   LAPAROSCOPY  1970s   Archie Endo 01/12/2011   Family History Family History  Problem Relation Age of Onset   Hypertension Mother    Hypertension Sister    Hypertension Maternal Aunt    Heart disease Maternal Aunt    Hypertension Maternal Uncle     Social History Social History   Tobacco Use   Smoking status: Never   Smokeless tobacco: Never  Vaping Use   Vaping Use: Never used  Substance Use Topics   Alcohol use: No   Drug use: No   Allergies Penicillins  Review of Systems Review of Systems All other systems are reviewed and are negative for acute change  except as noted in the HPI  Physical Exam Vital Signs  I have reviewed the triage vital signs BP 129/78 (BP Location: Right Arm)   Pulse 75   Temp 98.4 F (36.9 C) (Oral)   Resp 18   Ht 5\' 3"  (1.6 m)   Wt 69.9 kg   SpO2 97%   BMI 27.30 kg/m   Physical Exam Vitals reviewed.  Constitutional:      General: She is not in acute distress.    Appearance: She is well-developed. She is not diaphoretic.  HENT:     Head: Normocephalic and atraumatic.     Right Ear: External ear normal.     Left Ear: External ear normal.     Nose: Nose normal.  Eyes:     General: No scleral icterus.    Conjunctiva/sclera: Conjunctivae normal.  Neck:     Trachea: Phonation normal.  Cardiovascular:     Rate and Rhythm: Normal rate and regular rhythm.  Pulmonary:     Effort: Pulmonary effort is normal. No respiratory distress.     Breath sounds: No stridor.  Abdominal:     General: There is no distension.     Tenderness: There is no abdominal tenderness.  Musculoskeletal:        General: Normal range of motion.     Cervical back: Normal range of motion.  Neurological:     Mental Status: She is alert and oriented to person, place, and time.  Psychiatric:        Behavior: Behavior normal.    ED Results and Treatments Labs (all labs ordered are listed, but only abnormal results are displayed) Labs Reviewed  COMPREHENSIVE METABOLIC PANEL - Abnormal; Notable for the following components:      Result Value   Glucose, Bld 106 (*)    Total Protein 8.4 (*)    All other components within normal limits  CBC WITH DIFFERENTIAL/PLATELET - Abnormal; Notable for the following components:   Hemoglobin 11.7 (*)    All other components within normal limits  URINALYSIS, ROUTINE W REFLEX MICROSCOPIC - Abnormal; Notable for the following components:   Leukocytes,Ua SMALL (*)    Bacteria, UA RARE (*)    All other components within normal limits  RESP PANEL BY RT-PCR (FLU A&B, COVID) ARPGX2  C DIFFICILE  QUICK SCREEN W PCR REFLEX    GASTROINTESTINAL PANEL BY PCR, STOOL (REPLACES STOOL CULTURE)  AMMONIA  EKG  EKG Interpretation  Date/Time:    Ventricular Rate:    PR Interval:    QRS Duration:   QT Interval:    QTC Calculation:   R Axis:     Text Interpretation:         Radiology No results found.  Pertinent labs & imaging results that were available during my care of the patient were reviewed by me and considered in my medical decision making (see MDM for details).  Medications Ordered in ED Medications - No data to display                                                                                                                                   Procedures Procedures  (including critical care time)  Medical Decision Making / ED Course I have reviewed the nursing notes for this encounter and the patient's prior records (if available in EHR or on provided paperwork).  [name] was evaluated in Emergency Department on 08/09/2021 for the symptoms described in the history of present illness. She was evaluated in the context of the global COVID-19 pandemic, which necessitated consideration that the patient might be at risk for infection with the SARS-CoV-2 virus that causes COVID-19. Institutional protocols and algorithms that pertain to the evaluation of patients at risk for COVID-19 are in a state of rapid change based on information released by regulatory bodies including the CDC and federal and state organizations. These policies and algorithms were followed during the patient's care in the ED.     Intermittent foul smelling "fumes" with intermittent abd cramping. Abdomen benign.  Pertinent labs & imaging results that were available during my care of the patient were reviewed by me and considered in my medical decision making:  No leukocytosis or  significant anemia. No electrolyte derangements or renal sufficiency. No evidence of bili obstruction or pancreatitis. UA not suspicious for urinary tract infection.  Patient unable to provide a bowel movement during her 9-hour wait in the emergency department.  Highly unlikely this is C. difficile.   Low suspicion for serious intra-abdominal inflammatory/infectious process requiring additional work-up at this time.  Recommended keeping a log of the foods that exacerbate her symptoms and following up with her primary care provider and GI.  Final Clinical Impression(s) / ED Diagnoses Final diagnoses:  Abdominal discomfort   The patient appears reasonably screened and/or stabilized for discharge and I doubt any other medical condition or other [facility] requiring further screening, evaluation, or treatment in the ED at this time prior to discharge. Safe for discharge with strict return precautions.  Disposition: Discharge  Condition: Good  I have discussed the results, Dx and Tx plan with the patient/family who expressed understanding and agree(s) with the plan. Discharge instructions discussed at length. The patient/family was given strict return precautions who verbalized understanding of the instructions. No further questions at time of discharge.    ED Discharge Orders  None       Follow Up: Hayden Rasmussen, MD [place] [id number] [phone]  Call  to schedule an appointment for close follow up    This chart was dictated using voice recognition software.  Despite best efforts to proofread,  errors can occur which can change the documentation meaning.    Fatima Blank, MD 08/09/21 [phone]

## 2021-08-19 ENCOUNTER — Encounter (HOSPITAL_COMMUNITY): Payer: Self-pay

## 2021-08-19 ENCOUNTER — Emergency Department (HOSPITAL_COMMUNITY)
Admission: EM | Admit: 2021-08-19 | Discharge: 2021-08-20 | Disposition: A | Discharge status: Home / Self Care | Payer: Medicare (Managed Care) | Attending: Emergency Medicine | Admitting: Emergency Medicine

## 2021-08-19 ENCOUNTER — Emergency Department (HOSPITAL_COMMUNITY): Payer: Medicare (Managed Care)

## 2021-08-19 ENCOUNTER — Other Ambulatory Visit: Payer: Self-pay

## 2021-08-19 DIAGNOSIS — R072 Precordial pain: Secondary | ICD-10-CM | POA: Diagnosis not present

## 2021-08-19 DIAGNOSIS — R079 Chest pain, unspecified: Secondary | ICD-10-CM | POA: Diagnosis present

## 2021-08-19 DIAGNOSIS — I10 Essential (primary) hypertension: Secondary | ICD-10-CM | POA: Insufficient documentation

## 2021-08-19 DIAGNOSIS — Z79899 Other long term (current) drug therapy: Secondary | ICD-10-CM | POA: Diagnosis not present

## 2021-08-19 DIAGNOSIS — Z7982 Long term (current) use of aspirin: Secondary | ICD-10-CM | POA: Insufficient documentation

## 2021-08-19 DIAGNOSIS — R059 Cough, unspecified: Secondary | ICD-10-CM | POA: Diagnosis not present

## 2021-08-19 LAB — CBC
HCT: 35.4 % — ABNORMAL LOW (ref 36.0–46.0)
Hemoglobin: 11.4 g/dL — ABNORMAL LOW (ref 12.0–15.0)
MCH: 28.9 pg (ref 26.0–34.0)
MCHC: 32.2 g/dL (ref 30.0–36.0)
MCV: 89.8 fL (ref 80.0–100.0)
Platelets: 245 10*3/uL (ref 150–400)
RBC: 3.94 MIL/uL (ref 3.87–5.11)
RDW: 14 % (ref 11.5–15.5)
WBC: 5.9 10*3/uL (ref 4.0–10.5)
nRBC: 0 % (ref 0.0–0.2)

## 2021-08-19 LAB — TROPONIN I (HIGH SENSITIVITY): Troponin I (High Sensitivity): 11 ng/L (ref <18)

## 2021-08-19 LAB — BASIC METABOLIC PANEL
Anion gap: 7 (ref 5–15)
BUN: 9 mg/dL (ref 8–23)
CO2: 27 mmol/L (ref 22–32)
Calcium: 9 mg/dL (ref 8.9–10.3)
Chloride: 101 mmol/L (ref 98–111)
Creatinine, Ser: 0.65 mg/dL (ref 0.44–1.00)
GFR, Estimated: >60 mL/min (ref >60)
Glucose, Bld: 93 mg/dL (ref 70–99)
Potassium: 3.5 mmol/L (ref 3.5–5.1)
Sodium: 135 mmol/L (ref 135–145)

## 2021-08-19 NOTE — ED Notes (Signed)
Warm blankets placed over pt

## 2021-08-19 NOTE — ED Provider Notes (Signed)
[facility] EMERGENCY DEPARTMENT Provider Note   CSN: [id number] Arrival date & time: 08/19/21  1643     History Chief Complaint  Patient presents with   Chest Pain    [name] is a 70 y.o. female.  Patient presents to the emergency department with a chief complaint of right-sided chest pain since yesterday.  She reports that the symptoms have been intermittent.  She denies any cough, fever, shortness of breath.  She states that she did have a cough and cold a couple weeks ago.  She denies any heart or lung problems.  Denies any treatments prior to arrival.  She states that the pain lasted for about 15 to 20 minutes.  Denies any history of lung or breast cancer.  The history is provided by the patient. No language interpreter was used.      Past Medical History:  Diagnosis Date   History of stomach ulcers 1970s   "in college"   Hyperlipidemia    Hypertension    Obesity (BMI 30.0-34.9)     Patient Active Problem List   Diagnosis Date Noted   Mixed hyperlipidemia 04/13/2017   Recurrent Clostridium difficile diarrhea 03/16/2017   C. difficile colitis 03/16/2017   Colitis, Clostridium difficile 03/16/2017   Left adrenal mass ([place]) 03/13/2017   Clostridium difficile colitis 03/10/2017   AKI (acute kidney injury) ([place])    Dehydration    Hypokalemia    Essential hypertension 08/19/2006    Past Surgical History:  Procedure Laterality Date   ABDOMINAL HYSTERECTOMY  2017   "partial; in [place]"   BLADDER SUSPENSION  2017   "in [place]"   COLONOSCOPY  2006   COMBINED HYSTEROSCOPY DIAGNOSTIC / D&C  01/2005   Archie Endo 01/12/2011   LAPAROSCOPY  1970s   Archie Endo 01/12/2011     OB History   No obstetric history on file.     Family History  Problem Relation Age of Onset   Hypertension Mother    Hypertension Sister    Hypertension Maternal Aunt    Heart disease Maternal Aunt    Hypertension Maternal Uncle     Social History   Tobacco Use    Smoking status: Never   Smokeless tobacco: Never  Vaping Use   Vaping Use: Never used  Substance Use Topics   Alcohol use: No   Drug use: No    Home Medications Prior to Admission medications   Medication Sig Start Date End Date Taking? Authorizing Provider  aspirin 81 MG tablet Take 81 mg by mouth daily.    [provider]  atorvastatin (LIPITOR) 20 MG tablet Take 1 tablet (20 mg total) by mouth daily. 04/13/17   Alfonse Spruce, FNP  cholecalciferol (VITAMIN D) 1000 units tablet Take 1,000 Units by mouth daily.    [provider]  feeding supplement, ENSURE ENLIVE, (ENSURE ENLIVE) LIQD Take 237 mLs by mouth 3 (three) times daily between meals. Patient not taking: Reported on 03/21/2017 03/13/17   Debbe Odea, MD  lisinopril-hydrochlorothiazide (PRINZIDE,ZESTORETIC) 20-12.5 MG tablet Take 1 tablet by mouth daily. 04/07/17   Alfonse Spruce, FNP  methocarbamol (ROBAXIN) 500 MG tablet Take 1 tablet (500 mg total) by mouth 2 (two) times daily. 12/25/17   Palumbo, April, MD  Multiple Vitamin (MULTIVITAMIN) capsule Take 1 capsule by mouth daily.    [provider]  naproxen (NAPROSYN) 375 MG tablet Take 1 tablet (375 mg total) by mouth 2 (two) times daily. 12/25/17   Palumbo, April, MD  saccharomyces  boulardii (FLORASTOR) 250 MG capsule Take 1 capsule (250 mg total) by mouth 2 (two) times daily. 04/07/17   Alfonse Spruce, FNP  simethicone (MYLICON) 80 MG chewable tablet Chew 1 tablet (80 mg total) by mouth every 6 (six) hours as needed for flatulence. 04/07/17   Alfonse Spruce, FNP    Allergies    Penicillins  Review of Systems   Review of Systems  All other systems reviewed and are negative.  Physical Exam Updated Vital Signs BP 131/78    Pulse 65    Temp 98.4 F (36.9 C) (Oral)    Resp (!) 23    Ht 5\' 3"  (1.6 m)    Wt 69.9 kg    SpO2 99%    BMI 27.30 kg/m   Physical Exam Vitals and nursing note reviewed.  Constitutional:      General: She  is not in acute distress.    Appearance: She is well-developed.  HENT:     Head: Normocephalic and atraumatic.  Eyes:     Conjunctiva/sclera: Conjunctivae normal.  Cardiovascular:     Rate and Rhythm: Normal rate and regular rhythm.     Heart sounds: No murmur heard. Pulmonary:     Effort: Pulmonary effort is normal. No respiratory distress.     Breath sounds: Normal breath sounds.  Abdominal:     Palpations: Abdomen is soft.     Tenderness: There is no abdominal tenderness.  Musculoskeletal:        General: No swelling.     Cervical back: Neck supple.  Skin:    General: Skin is warm and dry.     Capillary Refill: Capillary refill takes less than 2 seconds.  Neurological:     Mental Status: She is alert.  Psychiatric:        Mood and Affect: Mood normal.    ED Results / Procedures / Treatments   Labs (all labs ordered are listed, but only abnormal results are displayed) Labs Reviewed  CBC - Abnormal; Notable for the following components:      Result Value   Hemoglobin 11.4 (*)    HCT 35.4 (*)    All other components within normal limits  BASIC METABOLIC PANEL  CBG MONITORING, ED  TROPONIN I (HIGH SENSITIVITY)  TROPONIN I (HIGH SENSITIVITY)    EKG None  Radiology DG Chest 1 View  Result Date: 08/19/2021 CLINICAL DATA:  Right-sided chest heaviness and soreness EXAM: CHEST  1 VIEW COMPARISON:  Chest radiograph 08/27/2018 FINDINGS: The cardiomediastinal silhouette is normal. There is calcified atherosclerotic plaque of the aortic arch. There is no focal consolidation or pulmonary edema. There is no pleural effusion or pneumothorax. There is faint nodular opacity projecting over the right upper lobe measuring proximally 1.9 cm. There is no acute osseous abnormality. IMPRESSION: Faint nodular opacity projecting over the right upper lobe may be artifactual, but nonemergent CT chest is recommended to exclude a parenchymal nodule. Otherwise, no radiographic evidence of acute  cardiopulmonary process. Electronically Signed   By: Valetta Mole M.D.   On: 08/19/2021 17:54    Procedures Procedures   Medications Ordered in ED Medications - No data to display  ED Course  I have reviewed the triage vital signs and the nursing notes.  Pertinent labs & imaging results that were available during my care of the patient were reviewed by me and considered in my medical decision making (see chart for details).    MDM Rules/Calculators/A&P  Patient here with right-sided chest pain x2 days.  Nothing makes the symptoms better or worse.  She has not had fever cough in the past week.  Chest x-ray shows a faint nodular opacity over the right upper lobe.  CT scan was ordered to confirm this, but CT scan reveals no nodule.  Laboratory work-up is reassuring.  Troponin is 11 x2.  Mild anemia of 11.4.  Uncertain cause of patient's intermittent chest pain for the past few days, but doesn't seem consistent with ACS and patient has reassuring workup here.  Doubt PE, patient is not hypoxic nor tachycardic.  Reassuring chest CT.  Will have patient follow-up with PCP.    Final Clinical Impression(s) / ED Diagnoses Final diagnoses:  Precordial chest pain    Rx / DC Orders ED Discharge Orders     None        Montine Circle, PA-C 30/07/62 2633    Delora Fuel, MD 35/45/62 5638    Delora Fuel, MD 93/73/42 [phone]

## 2021-08-19 NOTE — ED Triage Notes (Signed)
Pt arrived via POV for right sided chest heaviness and soreness since yesterday. Pt denies any other sx. Pt is A&Ox4. VSS.

## 2021-08-19 NOTE — ED Notes (Signed)
Phlebotomy attempted to get repeat troponin but pt says "lab work hurts but is not refusing her blood work."

## 2021-08-19 NOTE — ED Provider Notes (Signed)
Emergency Medicine Provider Triage Evaluation Note  [name] , a 70 y.o. female  was evaluated in triage.  Pt complains of chest discomfort.  Symptoms are right-sided.  Started yesterday.  Symptoms have been intermittent.  No radiation, vomiting, diaphoresis.  Nothing make symptoms better or worse.  She has a history of hypertension.  No previous cardiac history reported.  Review of Systems  Positive: Chest tightness Negative: Vomiting  Physical Exam  BP (!) 156/81 (BP Location: Right Arm)    Pulse 81    Temp 98.2 F (36.8 C) (Oral)    Resp 18    Ht 5\' 3"  (1.6 m)    Wt 69.9 kg    SpO2 97%    BMI 27.30 kg/m  Gen:   Awake, no distress   Resp:  Normal effort  MSK:   Moves extremities without difficulty  Other:  Lungs clear to auscultation bilaterally, heart regular rate and rhythm  Medical Decision Making  Medically screening exam initiated at 4:56 PM.  Appropriate orders placed.  [name] was informed that the remainder of the evaluation will be completed by another provider, this initial triage assessment does not replace that evaluation, and the importance of remaining in the ED until their evaluation is complete.     Carlisle Cater, PA-C 08/19/21 [place], [place], DO 08/19/21 2149

## 2021-08-20 ENCOUNTER — Emergency Department (HOSPITAL_COMMUNITY): Payer: Medicare (Managed Care)

## 2021-08-20 LAB — CBG MONITORING, ED: Glucose-Capillary: 93 mg/dL (ref 70–99)

## 2021-08-20 LAB — TROPONIN I (HIGH SENSITIVITY): Troponin I (High Sensitivity): 11 ng/L (ref <18)

## 2021-08-20 MED ORDER — IBUPROFEN 200 MG PO TABS: 400.0000 mg | ORAL_TABLET | Freq: Three times a day (TID) | ORAL | 0 refills | Status: AC | PRN

## 2021-08-20 MED ORDER — IOHEXOL 300 MG/ML  SOLN
100.0000 mL | Freq: Once | INTRAMUSCULAR | Status: AC | PRN
  Administered 2021-08-20: 01:00:00 100 mL via INTRAVENOUS

## 2021-08-20 MED ORDER — IBUPROFEN 200 MG PO TABS: 400.0000 mg | ORAL_TABLET | Freq: Three times a day (TID) | ORAL | 0 refills | Status: DC | PRN

## 2021-08-20 NOTE — ED Notes (Signed)
E-signature pad unavailable at time of pt discharge. This RN discussed discharge materials with pt and answered all pt questions. Pt stated understanding of discharge material. ? ?

## 2021-08-20 NOTE — Discharge Instructions (Signed)
No certain cause of your chest pain was found tonight.  Your heart markers were normal.  Your CT scan of the chest revealed no infection, masses, or tumors.  I recommend that you take an anti-inflammatory for the next couple of days and see if you improve.  Please follow-up with your doctor.  Please return to the emergency department if you worsen.

## 2021-08-20 NOTE — ED Notes (Signed)
Patient transported to CT 

## 2021-08-31 ENCOUNTER — Other Ambulatory Visit: Payer: Self-pay

## 2021-08-31 ENCOUNTER — Ambulatory Visit
Admission: EM | Admit: 2021-08-31 | Discharge: 2021-08-31 | Disposition: A | Discharge status: Home / Self Care | Payer: Medicare Other | Attending: Internal Medicine | Admitting: Internal Medicine

## 2021-08-31 ENCOUNTER — Encounter: Payer: Self-pay | Admitting: Emergency Medicine

## 2021-08-31 DIAGNOSIS — Z20822 Contact with and (suspected) exposure to covid-19: Secondary | ICD-10-CM

## 2021-08-31 NOTE — ED Triage Notes (Signed)
Needs covid test for work. No symptoms. Nurse visit

## 2021-09-02 LAB — SARS-COV-2, NAA 2 DAY TAT

## 2021-09-02 LAB — NOVEL CORONAVIRUS, NAA: SARS-CoV-2, NAA: NOT DETECTED

## 2022-08-31 ENCOUNTER — Ambulatory Visit: Admission: EM | Admit: 2022-08-31 | Discharge: 2022-08-31 | Discharge status: Absent without leave | Payer: Medicare HMO

## 2022-08-31 ENCOUNTER — Encounter (HOSPITAL_COMMUNITY): Payer: Self-pay | Admitting: *Deleted

## 2022-08-31 ENCOUNTER — Ambulatory Visit (HOSPITAL_COMMUNITY)
Admission: EM | Admit: 2022-08-31 | Discharge: 2022-08-31 | Disposition: A | Discharge status: Home / Self Care | Payer: Medicare HMO | Attending: Internal Medicine | Admitting: Internal Medicine

## 2022-08-31 DIAGNOSIS — U071 COVID-19: Secondary | ICD-10-CM | POA: Diagnosis not present

## 2022-08-31 DIAGNOSIS — J069 Acute upper respiratory infection, unspecified: Secondary | ICD-10-CM

## 2022-08-31 LAB — POC INFLUENZA A AND B ANTIGEN (URGENT CARE ONLY)
INFLUENZA A ANTIGEN, POC: NEGATIVE
INFLUENZA B ANTIGEN, POC: NEGATIVE

## 2022-08-31 MED ORDER — PROMETHAZINE-DM 6.25-15 MG/5ML PO SYRP: 2.5000 mL | ORAL_SOLUTION | Freq: Every evening | ORAL | 0 refills | Status: AC | PRN

## 2022-08-31 MED ORDER — BENZONATATE 100 MG PO CAPS
100.0000 mg | ORAL_CAPSULE | Freq: Three times a day (TID) | ORAL | 0 refills | Status: AC
Start: 2022-08-31 — End: ?

## 2022-08-31 NOTE — ED Triage Notes (Addendum)
C/O cough, nasal congestion, body aches, sneezing onset approx 3 days ago. Has been taking Claritin, home remedy cough med, Flonase. Pt requesting Covid and influenza tests.

## 2022-08-31 NOTE — Discharge Instructions (Signed)
You have a viral upper respiratory infection.  COVID-19 testing is pending. We will call you with results if positive. If your COVID test is positive, you must stay at home until day 6 of symptoms. On day 6, you may go out into public and go back to work, but you must wear a mask until day 11 of symptoms to prevent spread to others.  Take cetirizine and flonase as directed to help with nasal drainage.  Take Promethazine DM cough medication to help with your cough at nighttime so that you are able to sleep. Do not drive, drink alcohol, or go to work while taking this medication since it can make you sleepy. Only take this at nighttime.   Take tessalon pearles every 8 hours as needed for cough.  You may take tylenol 1,'000mg'$  as needed for aches and pains.  You may do salt water and baking soda gargles every 4 hours as needed for your throat pain.  Please put 1 teaspoon of salt and 1/2 teaspoon of baking soda in 8 ounces of warm water then gargle and spit the water out. You may also put 1 tablespoon of honey in warm water and drink this to soothe your throat.  Place a humidifier in your room at night to help decrease dry air that can irritate your airway and cause you to have a sore throat and cough.  Please try to eat a well-balanced diet while you are sick so that your body gets proper nutrition to heal.  If you develop any new or worsening symptoms, please return.  If your symptoms are severe, please go to the emergency room.  Follow-up with your primary care provider for further evaluation and management of your symptoms as well as ongoing wellness visits.  I hope you feel better!

## 2022-09-01 ENCOUNTER — Telehealth (HOSPITAL_COMMUNITY): Payer: Self-pay | Admitting: Emergency Medicine

## 2022-09-01 LAB — SARS CORONAVIRUS 2 (TAT 6-24 HRS): SARS Coronavirus 2: POSITIVE — AB

## 2022-09-01 NOTE — Telephone Encounter (Signed)
Patient called back with additional questions.  Wasn't sure if she needed an extended note, but she did confirm symptoms started 12/30, with that being Day 0, she is cleared to go back to work on 1/5.  Wearing a mask from 1/5-1/10.  I explained this to the patient twice, but she still seemed concerned that wasn't correct.  All other questions answered.

## 2022-09-02 NOTE — ED Provider Notes (Signed)
[facility]    CSN: [id number] Arrival date & time: 08/31/22  1706      History   Chief Complaint Chief Complaint  Patient presents with   Cough    HPI [name] is a 72 y.o. female.   Patient presents to urgent care for evaluation of cough, runny nose, sneezing, and generalized weakness/fatigue for the last approximately 3 days. No known sick contacts with similar symptoms. Tolerating food and fluids well without difficulty. No nausea, vomiting, diarrhea, or abdominal pain. Cough is dry and worse at nighttime. She is not a smoker and denies drug use. No history of chronic respiratory problems. Denies orthopnea, leg swelling, chest pain, shortness of breath, and heart palpitations. Has been using OTC medications and home remedies for symptoms without relief. She is vaccinated against COVID-19 and influenza.   Cough   Past Medical History:  Diagnosis Date   History of stomach ulcers 1970s   "in college"   Hyperlipidemia    Hypertension    Obesity (BMI 30.0-34.9)     Patient Active Problem List   Diagnosis Date Noted   Mixed hyperlipidemia 04/13/2017   Recurrent Clostridium difficile diarrhea 03/16/2017   C. difficile colitis 03/16/2017   Colitis, Clostridium difficile 03/16/2017   Left adrenal mass ([place]) 03/13/2017   Clostridium difficile colitis 03/10/2017   AKI (acute kidney injury) ([place])    Dehydration    Hypokalemia    Essential hypertension 08/19/2006    Past Surgical History:  Procedure Laterality Date   ABDOMINAL HYSTERECTOMY  2017   "partial; in [place]"   BLADDER SUSPENSION  2017   "in [place]"   COLONOSCOPY  2006   COMBINED HYSTEROSCOPY DIAGNOSTIC / D&C  01/2005   Archie Endo 01/12/2011   LAPAROSCOPY  1970s   Archie Endo 01/12/2011    OB History   No obstetric history on file.      Home Medications    Prior to Admission medications   Medication Sig Start Date End Date Taking? Authorizing Provider  alendronate (FOSAMAX) 70 MG tablet  Take 70 mg by mouth once a week. Take with a full glass of water on an empty stomach.   Yes [provider]  amLODipine (NORVASC) 10 MG tablet Take 10 mg by mouth 2 (two) times daily.   Yes [provider]  Ascorbic Acid (VITAMIN C) 100 MG tablet Take 100 mg by mouth daily.   Yes [provider]  aspirin 81 MG tablet Take 81 mg by mouth daily.   Yes [provider]  atorvastatin (LIPITOR) 20 MG tablet Take 1 tablet (20 mg total) by mouth daily. 04/13/17  Yes Hairston, Maylon Peppers, FNP  benzonatate (TESSALON) 100 MG capsule Take 1 capsule (100 mg total) by mouth every 8 (eight) hours. 08/31/22  Yes Talbot Grumbling, FNP  CALCIUM PO Take 1 tablet by mouth 2 (two) times daily.   Yes [provider]  cholecalciferol (VITAMIN D) 1000 units tablet Take 1,000 Units by mouth daily.   Yes [provider]  Coenzyme Q10 (COQ10) 100 MG CAPS Take 1 tablet by mouth daily.   Yes [provider]  fluticasone (FLONASE) 50 MCG/ACT nasal spray Place 2 sprays into both nostrils daily as needed for allergies or rhinitis.   Yes [provider]  ketotifen (ZADITOR) 0.025 % ophthalmic solution Place 1 drop into both eyes in the morning, at noon, and at bedtime.   Yes [provider]  latanoprost (XALATAN) 0.005 % ophthalmic solution Place 1 drop into  both eyes at bedtime.   Yes [provider]  losartan-hydrochlorothiazide (HYZAAR) 100-12.5 MG tablet Take 1 tablet by mouth 2 (two) times daily.   Yes [provider]  Lysine 1000 MG TABS Take 1 tablet by mouth as needed (bone health).   Yes [provider]  MAGNESIUM PO Take by mouth.   Yes [provider]  montelukast (SINGULAIR) 10 MG tablet Take 10 mg by mouth as needed (cough). 07/13/21  Yes [provider]  Multiple Vitamin (MULTIVITAMIN) capsule Take 1 capsule by mouth daily.   Yes [provider]  Polyvinyl Alcohol-Povidone PF  (REFRESH) 1.4-0.6 % SOLN Place 1 drop into both eyes in the morning, at noon, and at bedtime.   Yes [provider]  promethazine-dextromethorphan (PROMETHAZINE-DM) 6.25-15 MG/5ML syrup Take 2.5 mLs by mouth at bedtime as needed for cough. 08/31/22  Yes Talbot Grumbling, FNP  timolol (BETIMOL) 0.5 % ophthalmic solution Place 1 drop into both eyes in the morning.   Yes [provider]  TURMERIC PO Take by mouth.   Yes [provider]    Family History Family History  Problem Relation Age of Onset   Hypertension Mother    Hypertension Sister    Hypertension Maternal Aunt    Heart disease Maternal Aunt    Hypertension Maternal Uncle     Social History Social History   Tobacco Use   Smoking status: Never   Smokeless tobacco: Never  Vaping Use   Vaping Use: Never used  Substance Use Topics   Alcohol use: No   Drug use: No     Allergies   Penicillins   Review of Systems Review of Systems  Respiratory:  Positive for cough.   Per HPI   Physical Exam Triage Vital Signs ED Triage Vitals  Enc Vitals Group     BP 08/31/22 1954 (!) 179/82     Pulse Rate 08/31/22 1954 74     Resp 08/31/22 1954 16     Temp 08/31/22 1954 97.7 F (36.5 C)     Temp Source 08/31/22 1954 Oral     SpO2 08/31/22 1954 97 %     Weight --      Height --      Head Circumference --      Peak Flow --      Pain Score 08/31/22 2024 0     Pain Loc --      Pain Edu? --      Excl. in [place]? --    No data found.  Updated Vital Signs BP (!) 179/82   Pulse 74   Temp 97.7 F (36.5 C) (Oral)   Resp 16   SpO2 97%   Visual Acuity Right Eye Distance:   Left Eye Distance:   Bilateral Distance:    Right Eye Near:   Left Eye Near:    Bilateral Near:     Physical Exam Vitals and nursing note reviewed.  Constitutional:      Appearance: Normal appearance. She is not ill-appearing or toxic-appearing.  HENT:     Head: Normocephalic and atraumatic.     Right Ear: Hearing,  tympanic membrane, ear canal and external ear normal.     Left Ear: Hearing, tympanic membrane, ear canal and external ear normal.     Nose: Congestion present.     Mouth/Throat:     Lips: Pink.     Mouth: Mucous membranes are moist.     Pharynx: No posterior oropharyngeal erythema.  Eyes:     General: Lids are normal. Vision grossly intact. Gaze aligned appropriately.        Right eye: No discharge.        Left eye: No discharge.     Extraocular Movements: Extraocular movements intact.     Conjunctiva/sclera: Conjunctivae normal.     Pupils: Pupils are equal, round, and reactive to light.  Cardiovascular:     Rate and Rhythm: Normal rate and regular rhythm.     Heart sounds: Normal heart sounds, S1 normal and S2 normal.  Pulmonary:     Effort: Pulmonary effort is normal. No respiratory distress.     Breath sounds: Normal breath sounds and air entry. No stridor. No wheezing, rhonchi or rales.  Chest:     Chest wall: No tenderness.  Abdominal:     General: There is no distension.  Musculoskeletal:     Cervical back: Neck supple.  Lymphadenopathy:     Cervical: No cervical adenopathy.  Skin:    General: Skin is warm and dry.     Capillary Refill: Capillary refill takes less than 2 seconds.     Findings: No rash.  Neurological:     General: No focal deficit present.     Mental Status: She is alert and oriented to person, place, and time. Mental status is at baseline.     Cranial Nerves: No dysarthria or facial asymmetry.  Psychiatric:        Mood and Affect: Mood normal.        Speech: Speech normal.        Behavior: Behavior normal.        Thought Content: Thought content normal.        Judgment: Judgment normal.      UC Treatments / Results  Labs (all labs ordered are listed, but only abnormal results are displayed)   EKG   Radiology No results found.  Procedures Procedures (including critical care time)  Medications Ordered in UC Medications - No data to  display  Initial Impression / Assessment and Plan / UC Course  I have reviewed the triage vital signs and the nursing notes.  Pertinent labs & imaging results that were available during my care of the patient were reviewed by me and considered in my medical decision making (see chart for details).   Viral URI with cough Symptoms and physical exam consistent with a viral upper respiratory tract infection that will likely resolve with rest, fluids, and prescriptions for symptomatic relief. No indication for imaging today based on stable cardiopulmonary exam and hemodynamically stable vital signs. COVID-19 PCR testing is pending.  We will call patient if this is positive.  Quarantine guidelines discussed. Currently on day 4 of symptoms and does qualify for antiviral therapy.  Promethazine DM and tessalon perles sent to pharmacy for symptomatic relief to be taken as prescribed. Promethazine DM cough medication may be used as needed only at bedtime due to possible drowsiness side effect (no alcohol, working, or driving while taking this advised). May use ibuprofen/tylenol over the counter for body aches, fever/chills, and overall discomfort associated with viral illness. Nonpharmacologic interventions for symptom relief provided and after visit summary below.   Strict ED/urgent care return precautions given.  Patient verbalizes understanding and agreement with plan.  Counseled patient regarding possible side effects and uses of all medications prescribed at today's visit.  Patient verbalizes understanding and agreement with plan.  All questions answered.  Patient discharged from urgent care in stable condition.  Final Clinical Impressions(s) / UC Diagnoses   Final diagnoses:  Viral URI with cough     Discharge Instructions      You have a viral upper respiratory infection.  COVID-19 testing is pending. We will call you with results if positive. If your COVID test is positive, you must  stay at home until day 6 of symptoms. On day 6, you may go out into public and go back to work, but you must wear a mask until day 11 of symptoms to prevent spread to others.  Take cetirizine and flonase as directed to help with nasal drainage.  Take Promethazine DM cough medication to help with your cough at nighttime so that you are able to sleep. Do not drive, drink alcohol, or go to work while taking this medication since it can make you sleepy. Only take this at nighttime.   Take tessalon pearles every 8 hours as needed for cough.  You may take tylenol 1,'000mg'$  as needed for aches and pains.  You may do salt water and baking soda gargles every 4 hours as needed for your throat pain.  Please put 1 teaspoon of salt and 1/2 teaspoon of baking soda in 8 ounces of warm water then gargle and spit the water out. You may also put 1 tablespoon of honey in warm water and drink this to soothe your throat.  Place a humidifier in your room at night to help decrease dry air that can irritate your airway and cause you to have a sore throat and cough.  Please try to eat a well-balanced diet while you are sick so that your body gets proper nutrition to heal.  If you develop any new or worsening symptoms, please return.  If your symptoms are severe, please go to the emergency room.  Follow-up with your primary care provider for further evaluation and management of your symptoms as well as ongoing wellness visits.  I hope you feel better!     ED Prescriptions     Medication Sig Dispense Auth. Provider   promethazine-dextromethorphan (PROMETHAZINE-DM) 6.25-15 MG/5ML syrup Take 2.5 mLs by mouth at bedtime as needed for cough. 118 mL Joella Prince M, FNP   benzonatate (TESSALON) 100 MG capsule Take 1 capsule (100 mg total) by mouth every 8 (eight) hours. 21 capsule Talbot Grumbling, FNP      PDMP not reviewed this encounter.   Talbot Grumbling, [place] 09/02/22 2211

## 2022-12-13 DIAGNOSIS — N9089 Other specified noninflammatory disorders of vulva and perineum: Secondary | ICD-10-CM | POA: Insufficient documentation

## 2023-05-19 ENCOUNTER — Telehealth: Payer: Self-pay

## 2023-05-19 NOTE — Telephone Encounter (Addendum)
Patient returned call, stated she had a pap smear done less than 3 years ago, needs to know why she could not have a pap smear more frequent than every 3 years. Patient informed that we have to follow the ASCCCP guidelines, which recommend pap smears every 3 years for those that are low risk for cervical cancer. Patient stated she thinks she may be having some postmenopausal changes, such as vaginal dryness, and will discuss getting a pap smear via insurance or out-of pocket.   Patient left message on voicemail requesting information regarding free cervical screening. Attempted to return call, Left message on voicemail requesting a return call.

## 2023-11-27 ENCOUNTER — Ambulatory Visit: Admission: EM | Admit: 2023-11-27 | Discharge: 2023-11-27 | Discharge status: Against Medical Advice | Payer: Self-pay

## 2023-11-27 DIAGNOSIS — M545 Low back pain, unspecified: Secondary | ICD-10-CM | POA: Insufficient documentation

## 2023-11-27 DIAGNOSIS — R1032 Left lower quadrant pain: Secondary | ICD-10-CM | POA: Insufficient documentation

## 2023-11-27 DIAGNOSIS — R1031 Right lower quadrant pain: Secondary | ICD-10-CM | POA: Insufficient documentation

## 2023-11-27 DIAGNOSIS — I10 Essential (primary) hypertension: Secondary | ICD-10-CM | POA: Insufficient documentation

## 2023-11-27 NOTE — ED Notes (Signed)
 Pt presents to [facility] stating she does not wish to be seen by a provider, she only is requesting assistance placing her [employer] placement. Talked with pt informing her that unfortunately we are not able to assist with that, as it would be a liability and safety issue. Pt verbalized understanding.
